# Patient Record
Sex: Female | Born: 1980 | Race: Black or African American | Hispanic: No | Marital: Married | Smoking: Never smoker
Health system: Southern US, Community
[De-identification: ages and names within clinical notes are randomized; demographics above are authoritative.]

## PROBLEM LIST (undated history)

## (undated) DIAGNOSIS — Z8619 Personal history of other infectious and parasitic diseases: Secondary | ICD-10-CM

## (undated) DIAGNOSIS — I10 Essential (primary) hypertension: Secondary | ICD-10-CM

## (undated) HISTORY — DX: Personal history of other infectious and parasitic diseases: Z86.19

## (undated) HISTORY — DX: Essential (primary) hypertension: I10

---

## 2000-10-08 ENCOUNTER — Emergency Department (HOSPITAL_COMMUNITY): Admission: EM | Admit: 2000-10-08 | Discharge: 2000-10-08 | Payer: Self-pay | Admitting: Emergency Medicine

## 2002-03-20 ENCOUNTER — Other Ambulatory Visit: Admission: RE | Admit: 2002-03-20 | Discharge: 2002-03-20 | Payer: Self-pay | Admitting: Gynecology

## 2005-11-17 ENCOUNTER — Other Ambulatory Visit: Admission: RE | Admit: 2005-11-17 | Discharge: 2005-11-17 | Payer: Self-pay | Admitting: Obstetrics and Gynecology

## 2012-02-17 ENCOUNTER — Other Ambulatory Visit (HOSPITAL_COMMUNITY): Payer: Self-pay | Admitting: Obstetrics and Gynecology

## 2012-02-17 DIAGNOSIS — E041 Nontoxic single thyroid nodule: Secondary | ICD-10-CM

## 2012-02-28 ENCOUNTER — Other Ambulatory Visit (HOSPITAL_COMMUNITY): Payer: Self-pay

## 2013-03-01 ENCOUNTER — Other Ambulatory Visit (HOSPITAL_COMMUNITY): Payer: Self-pay | Admitting: Obstetrics and Gynecology

## 2013-03-01 DIAGNOSIS — E041 Nontoxic single thyroid nodule: Secondary | ICD-10-CM

## 2013-03-05 ENCOUNTER — Ambulatory Visit (HOSPITAL_COMMUNITY): Admission: RE | Admit: 2013-03-05 | Payer: Self-pay | Source: Ambulatory Visit

## 2013-03-05 ENCOUNTER — Other Ambulatory Visit (HOSPITAL_COMMUNITY): Payer: Self-pay | Admitting: Obstetrics and Gynecology

## 2013-03-05 DIAGNOSIS — E041 Nontoxic single thyroid nodule: Secondary | ICD-10-CM

## 2013-03-12 ENCOUNTER — Ambulatory Visit (HOSPITAL_COMMUNITY)
Admission: RE | Admit: 2013-03-12 | Discharge: 2013-03-12 | Disposition: A | Discharge status: Home / Self Care | Payer: BC Managed Care – PPO | Source: Ambulatory Visit | Attending: Obstetrics and Gynecology | Admitting: Obstetrics and Gynecology

## 2013-03-12 DIAGNOSIS — E041 Nontoxic single thyroid nodule: Secondary | ICD-10-CM

## 2015-02-24 ENCOUNTER — Emergency Department (HOSPITAL_COMMUNITY): Payer: BLUE CROSS/BLUE SHIELD

## 2015-02-24 ENCOUNTER — Encounter (HOSPITAL_COMMUNITY): Payer: Self-pay | Admitting: Emergency Medicine

## 2015-02-24 ENCOUNTER — Emergency Department (HOSPITAL_COMMUNITY)
Admission: EM | Admit: 2015-02-24 | Discharge: 2015-02-24 | Disposition: A | Discharge status: Home / Self Care | Payer: BLUE CROSS/BLUE SHIELD | Attending: Emergency Medicine | Admitting: Emergency Medicine

## 2015-02-24 DIAGNOSIS — W108XXA Fall (on) (from) other stairs and steps, initial encounter: Secondary | ICD-10-CM | POA: Diagnosis not present

## 2015-02-24 DIAGNOSIS — S99912A Unspecified injury of left ankle, initial encounter: Secondary | ICD-10-CM | POA: Diagnosis present

## 2015-02-24 DIAGNOSIS — Z793 Long term (current) use of hormonal contraceptives: Secondary | ICD-10-CM | POA: Diagnosis not present

## 2015-02-24 DIAGNOSIS — Y998 Other external cause status: Secondary | ICD-10-CM | POA: Insufficient documentation

## 2015-02-24 DIAGNOSIS — Y9289 Other specified places as the place of occurrence of the external cause: Secondary | ICD-10-CM | POA: Diagnosis not present

## 2015-02-24 DIAGNOSIS — R52 Pain, unspecified: Secondary | ICD-10-CM

## 2015-02-24 DIAGNOSIS — Z79899 Other long term (current) drug therapy: Secondary | ICD-10-CM | POA: Insufficient documentation

## 2015-02-24 DIAGNOSIS — Y9389 Activity, other specified: Secondary | ICD-10-CM | POA: Diagnosis not present

## 2015-02-24 DIAGNOSIS — S93402A Sprain of unspecified ligament of left ankle, initial encounter: Secondary | ICD-10-CM | POA: Insufficient documentation

## 2015-02-24 MED ORDER — KETOROLAC TROMETHAMINE 60 MG/2ML IM SOLN: 60.0000 mg | Freq: Once | INTRAMUSCULAR | Status: DC

## 2015-02-24 MED ORDER — NAPROXEN 500 MG PO TABS: 500.0000 mg | ORAL_TABLET | Freq: Two times a day (BID) | ORAL | Status: DC

## 2015-02-24 MED ORDER — KETOROLAC TROMETHAMINE 60 MG/2ML IM SOLN
INTRAMUSCULAR | Status: AC
  Filled 2015-02-24: qty 2

## 2015-02-24 NOTE — ED Provider Notes (Signed)
CSN: [id number]     Arrival date & time 02/24/15  0123 History   First MD Initiated Contact with Patient 02/24/15 0133     Chief Complaint  Patient presents with  . Ankle Pain     (Consider location/radiation/quality/duration/timing/severity/associated sxs/prior Treatment) Patient is a 34 y.o. female presenting with ankle pain. The history is provided by the patient.  Ankle Pain Location:  Ankle Time since incident:  10 hours Injury: yes   Mechanism of injury: fall   Fall:    Fall occurred:  Down stairs (1 step)   Impact surface:  Hard floor   Point of impact:  Feet   Entrapped after fall: no   Ankle location:  L ankle Pain details:    Quality:  Aching   Radiates to:  Does not radiate   Severity:  Moderate   Timing:  Constant   Progression:  Unchanged Chronicity:  New Dislocation: no   Foreign body present:  No foreign bodies Prior injury to area:  No Relieved by:  Nothing Worsened by:  Nothing tried Ineffective treatments:  None tried Associated symptoms: no tingling   Risk factors: no concern for non-accidental trauma     History reviewed. No pertinent past medical history. History reviewed. No pertinent past surgical history. History reviewed. No pertinent family history. History  Substance Use Topics  . Smoking status: Never Smoker   . Smokeless tobacco: Not on file  . Alcohol Use: Yes     Comment: occ   OB History    No data available     Review of Systems  All other systems reviewed and are negative.     Allergies  Review of patient's allergies indicates no known allergies.  Home Medications   Prior to Admission medications   Medication Sig Start Date End Date Taking? Authorizing Provider  ALTAVERA 0.15-30 MG-MCG tablet Take 1 tablet by mouth daily. 02/04/15  Yes Historical Provider, MD  Aspirin-Acetaminophen-Caffeine (GOODYS EXTRA STRENGTH) [phone] MG PACK Take 1 Package by mouth every 6 (six) hours as needed (pain).   Yes Historical  Provider, MD  lisinopril (PRINIVIL,ZESTRIL) 10 MG tablet TAKE 1 TABLET (10 MG) BY MOUTH DAILY 12/19/14  Yes Historical Provider, MD   BP 115/74 mmHg  Pulse 80  Temp(Src) 98.4 F (36.9 C) (Oral)  Resp 18  Ht  (1.6 m)  Wt 175 lb (79.379 kg)  BMI 31.01 kg/m2  SpO2 100%  LMP 02/17/2015 Physical Exam  Constitutional: She is oriented to person, place, and time. She appears well-developed and well-nourished. No distress.  HENT:  Head: Normocephalic and atraumatic.  Right Ear: External ear normal.  Left Ear: External ear normal.  Mouth/Throat: Oropharynx is clear and moist.  Eyes: Conjunctivae and EOM are normal. Pupils are equal, round, and reactive to light.  Neck: Normal range of motion. Neck supple.  Cardiovascular: Normal rate, regular rhythm and intact distal pulses.   Pulmonary/Chest: Effort normal and breath sounds normal. No respiratory distress. She has no wheezes. She has no rales.  Abdominal: Soft. Bowel sounds are normal. There is no tenderness. There is no rebound and no guarding.  Musculoskeletal: Normal range of motion. She exhibits no edema.       Left ankle: Normal. She exhibits normal range of motion, no swelling, no ecchymosis, no deformity, no laceration and normal pulse. No head of 5th metatarsal and no proximal fibula tenderness found. Achilles tendon normal.  Neurological: She is alert and oriented to person, place, and time. She has normal reflexes.  Skin: Skin is warm and dry.  Psychiatric: She has a normal mood and affect.    ED Course  Procedures (including critical care time) Labs Review Labs Reviewed - No data to display  Imaging Review No results found.   EKG Interpretation None      MDM   Final diagnoses:  Pain    NSAIDs ice ASO and crutches    Anhthu Perdew, MD 02/24/15 [phone]

## 2015-02-24 NOTE — ED Notes (Signed)
Pt ambulating independently w/ steady gait on d/c in no acute distress, A&Ox4. D/c instructions reviewed w/ pt and family - pt and family deny any further questions or concerns at present. Rx given x1  

## 2015-02-24 NOTE — ED Notes (Signed)
Pt presents with L ankle pain and swelling. Pt reports falling down 2 carpeted steps last evening . Swelling noted

## 2016-02-03 LAB — OB RESULTS CONSOLE RPR: RPR: NONREACTIVE

## 2016-02-03 LAB — OB RESULTS CONSOLE GC/CHLAMYDIA
Chlamydia: NEGATIVE
GC PROBE AMP, GENITAL: NEGATIVE

## 2016-02-03 LAB — OB RESULTS CONSOLE HEPATITIS B SURFACE ANTIGEN: Hepatitis B Surface Ag: NEGATIVE

## 2016-02-03 LAB — OB RESULTS CONSOLE HIV ANTIBODY (ROUTINE TESTING): HIV: NONREACTIVE

## 2016-02-03 LAB — OB RESULTS CONSOLE ABO/RH: RH TYPE: POSITIVE

## 2016-02-03 LAB — OB RESULTS CONSOLE ANTIBODY SCREEN: ANTIBODY SCREEN: NEGATIVE

## 2016-02-03 LAB — OB RESULTS CONSOLE RUBELLA ANTIBODY, IGM: Rubella: IMMUNE

## 2016-04-28 ENCOUNTER — Encounter (HOSPITAL_COMMUNITY): Payer: Self-pay

## 2016-04-28 ENCOUNTER — Emergency Department (HOSPITAL_COMMUNITY)
Admission: EM | Admit: 2016-04-28 | Discharge: 2016-04-29 | Discharge status: Against Medical Advice | Payer: BLUE CROSS/BLUE SHIELD | Attending: Emergency Medicine | Admitting: Emergency Medicine

## 2016-04-28 DIAGNOSIS — O26891 Other specified pregnancy related conditions, first trimester: Secondary | ICD-10-CM | POA: Diagnosis not present

## 2016-04-28 DIAGNOSIS — Z7982 Long term (current) use of aspirin: Secondary | ICD-10-CM | POA: Insufficient documentation

## 2016-04-28 DIAGNOSIS — Z5321 Procedure and treatment not carried out due to patient leaving prior to being seen by health care provider: Secondary | ICD-10-CM | POA: Diagnosis not present

## 2016-04-28 DIAGNOSIS — Z791 Long term (current) use of non-steroidal anti-inflammatories (NSAID): Secondary | ICD-10-CM | POA: Insufficient documentation

## 2016-04-28 DIAGNOSIS — Z3A20 20 weeks gestation of pregnancy: Secondary | ICD-10-CM | POA: Diagnosis not present

## 2016-04-28 DIAGNOSIS — M5440 Lumbago with sciatica, unspecified side: Secondary | ICD-10-CM | POA: Insufficient documentation

## 2016-04-28 DIAGNOSIS — M545 Low back pain: Secondary | ICD-10-CM

## 2016-04-28 NOTE — ED Triage Notes (Signed)
Pt complains of upper back pain that radiates under her arms, she thinks it may be from the way she sleeps, no injury noted

## 2016-04-28 NOTE — ED Notes (Signed)
Bed: [facility] Expected date:  Expected time:  Means of arrival:  Comments: 

## 2016-04-29 NOTE — ED Notes (Addendum)
Pt is [redacted] weeks pregnant. C/o 4/10 pain in the middle back. Pt reports taking tylenol 500mg  at 22:30. Sitting upright is the only position helping the pain. Movement of the neck, arms, and deep breaths intensify the pain. Pt contacted PCP and was advised to take tylenol for 48 hours and if no relief follow up with an appointment. After taking a nap pain became so severe that pt decided to be seen in ED.

## 2016-04-29 NOTE — ED Provider Notes (Signed)
Patient eloped from the emergency department before provider evaluation. I did not see or evaluate this patient. Triage note states patient is [redacted] weeks pregnant and complains of 4/10 pain in the middle of her back. She reports taking Tylenol at 11:30 PM and sitting upright is feeling position that helps the pain.      Dierdre ForthHannah Amma Crear, PA-C 04/29/16 0305    April Palumbo, MD 04/29/16 [phone]

## 2016-04-29 NOTE — ED Notes (Signed)
Pt left before the provider could see them.

## 2016-09-15 ENCOUNTER — Encounter (HOSPITAL_COMMUNITY): Payer: Self-pay | Admitting: *Deleted

## 2016-09-15 ENCOUNTER — Telehealth (HOSPITAL_COMMUNITY): Payer: Self-pay | Admitting: *Deleted

## 2016-09-15 LAB — OB RESULTS CONSOLE GBS: STREP GROUP B AG: POSITIVE

## 2016-09-15 NOTE — Telephone Encounter (Signed)
Preadmission screen  

## 2016-09-20 ENCOUNTER — Encounter (HOSPITAL_COMMUNITY): Payer: Self-pay

## 2016-09-20 ENCOUNTER — Inpatient Hospital Stay (HOSPITAL_COMMUNITY)
Admission: AD | Admit: 2016-09-20 | Discharge: 2016-09-23 | DRG: 765 | Disposition: A | Discharge status: Home / Self Care | Payer: BLUE CROSS/BLUE SHIELD | Source: Ambulatory Visit | Attending: Obstetrics and Gynecology | Admitting: Obstetrics and Gynecology

## 2016-09-20 ENCOUNTER — Inpatient Hospital Stay (HOSPITAL_COMMUNITY): Payer: BLUE CROSS/BLUE SHIELD | Admitting: Anesthesiology

## 2016-09-20 ENCOUNTER — Encounter (HOSPITAL_COMMUNITY)
Admission: AD | Disposition: A | Discharge status: Home / Self Care | Payer: Self-pay | Source: Ambulatory Visit | Attending: Obstetrics and Gynecology

## 2016-09-20 DIAGNOSIS — D72829 Elevated white blood cell count, unspecified: Secondary | ICD-10-CM | POA: Diagnosis present

## 2016-09-20 DIAGNOSIS — O9912 Other diseases of the blood and blood-forming organs and certain disorders involving the immune mechanism complicating childbirth: Secondary | ICD-10-CM | POA: Diagnosis present

## 2016-09-20 DIAGNOSIS — Z3A40 40 weeks gestation of pregnancy: Secondary | ICD-10-CM

## 2016-09-20 DIAGNOSIS — Z833 Family history of diabetes mellitus: Secondary | ICD-10-CM

## 2016-09-20 DIAGNOSIS — Z3493 Encounter for supervision of normal pregnancy, unspecified, third trimester: Secondary | ICD-10-CM | POA: Diagnosis present

## 2016-09-20 DIAGNOSIS — Z8249 Family history of ischemic heart disease and other diseases of the circulatory system: Secondary | ICD-10-CM

## 2016-09-20 DIAGNOSIS — O99824 Streptococcus B carrier state complicating childbirth: Secondary | ICD-10-CM | POA: Diagnosis present

## 2016-09-20 DIAGNOSIS — Z823 Family history of stroke: Secondary | ICD-10-CM | POA: Diagnosis not present

## 2016-09-20 LAB — CBC
HCT: 39.5 % (ref 36.0–46.0)
HEMATOCRIT: 35.6 % — AB (ref 36.0–46.0)
HEMOGLOBIN: 13.8 g/dL (ref 12.0–15.0)
HEMOGLOBIN: 12.2 g/dL (ref 12.0–15.0)
MCH: 30.6 pg (ref 26.0–34.0)
MCH: 30 pg (ref 26.0–34.0)
MCHC: 34.9 g/dL (ref 30.0–36.0)
MCHC: 34.3 g/dL (ref 30.0–36.0)
MCV: 87.6 fL (ref 78.0–100.0)
MCV: 87.5 fL (ref 78.0–100.0)
Platelets: 282 10*3/uL (ref 150–400)
Platelets: 243 10*3/uL (ref 150–400)
RBC: 4.51 MIL/uL (ref 3.87–5.11)
RBC: 4.07 MIL/uL (ref 3.87–5.11)
RDW: 15 % (ref 11.5–15.5)
RDW: 14.9 % (ref 11.5–15.5)
WBC: 14.8 10*3/uL — ABNORMAL HIGH (ref 4.0–10.5)
WBC: 22.8 10*3/uL — AB (ref 4.0–10.5)

## 2016-09-20 LAB — RPR: RPR Ser Ql: NONREACTIVE

## 2016-09-20 LAB — TYPE AND SCREEN
ABO/RH(D): O POS
Antibody Screen: NEGATIVE

## 2016-09-20 LAB — ABO/RH: ABO/RH(D): O POS

## 2016-09-20 SURGERY — Surgical Case
Anesthesia: Epidural

## 2016-09-20 MED ORDER — DIBUCAINE 1 % RE OINT: 1.0000 "application " | TOPICAL_OINTMENT | RECTAL | Status: DC | PRN

## 2016-09-20 MED ORDER — OXYTOCIN 40 UNITS IN LACTATED RINGERS INFUSION - SIMPLE MED
1.0000 m[IU]/min | INTRAVENOUS | Status: DC
Start: 2016-09-20 — End: 2016-09-20
  Administered 2016-09-20: 2 m[IU]/min via INTRAVENOUS
  Filled 2016-09-20: qty 1000

## 2016-09-20 MED ORDER — ONDANSETRON HCL 4 MG/2ML IJ SOLN
4.0000 mg | Freq: Three times a day (TID) | INTRAMUSCULAR | Status: DC | PRN
  Administered 2016-09-21: 4 mg via INTRAVENOUS
  Filled 2016-09-20: qty 2

## 2016-09-20 MED ORDER — PHENYLEPHRINE 40 MCG/ML (10ML) SYRINGE FOR IV PUSH (FOR BLOOD PRESSURE SUPPORT)
80.0000 ug | PREFILLED_SYRINGE | INTRAVENOUS | Status: DC | PRN
  Filled 2016-09-20: qty 10

## 2016-09-20 MED ORDER — LIDOCAINE HCL (PF) 1 % IJ SOLN
INTRAMUSCULAR | Status: DC | PRN
  Administered 2016-09-20 (×2): 4 mL

## 2016-09-20 MED ORDER — LACTATED RINGERS IV SOLN
INTRAVENOUS | Status: DC
  Administered 2016-09-21: 06:00:00 via INTRAVENOUS

## 2016-09-20 MED ORDER — ACETAMINOPHEN 325 MG PO TABS: 650.0000 mg | ORAL_TABLET | ORAL | Status: DC | PRN

## 2016-09-20 MED ORDER — MENTHOL 3 MG MT LOZG: 1.0000 | LOZENGE | OROMUCOSAL | Status: DC | PRN

## 2016-09-20 MED ORDER — OXYCODONE HCL 5 MG PO TABS: 10.0000 mg | ORAL_TABLET | ORAL | Status: DC | PRN

## 2016-09-20 MED ORDER — OXYTOCIN BOLUS FROM INFUSION: 500.0000 mL | Freq: Once | INTRAVENOUS | Status: DC

## 2016-09-20 MED ORDER — COCONUT OIL OIL: 1.0000 "application " | TOPICAL_OIL | Status: DC | PRN

## 2016-09-20 MED ORDER — DIPHENHYDRAMINE HCL 50 MG/ML IJ SOLN: 12.5000 mg | INTRAMUSCULAR | Status: DC | PRN

## 2016-09-20 MED ORDER — OXYCODONE HCL 5 MG PO TABS
5.0000 mg | ORAL_TABLET | ORAL | Status: DC | PRN
Start: 2016-09-20 — End: 2016-09-23

## 2016-09-20 MED ORDER — SODIUM CHLORIDE 0.9 % IR SOLN
Status: DC | PRN
  Administered 2016-09-20: 1

## 2016-09-20 MED ORDER — CEFAZOLIN SODIUM-DEXTROSE 2-3 GM-% IV SOLR
INTRAVENOUS | Status: DC | PRN
  Administered 2016-09-20: 2 g via INTRAVENOUS

## 2016-09-20 MED ORDER — MEPERIDINE HCL 25 MG/ML IJ SOLN
INTRAMUSCULAR | Status: DC | PRN
  Administered 2016-09-20 (×2): 12.5 mg via INTRAVENOUS

## 2016-09-20 MED ORDER — LIDOCAINE HCL (PF) 1 % IJ SOLN: 30.0000 mL | INTRAMUSCULAR | Status: DC | PRN

## 2016-09-20 MED ORDER — LACTATED RINGERS IV SOLN: INTRAVENOUS | Status: DC

## 2016-09-20 MED ORDER — OXYTOCIN 40 UNITS IN LACTATED RINGERS INFUSION - SIMPLE MED: 2.5000 [IU]/h | INTRAVENOUS | Status: AC

## 2016-09-20 MED ORDER — FENTANYL CITRATE (PF) 100 MCG/2ML IJ SOLN: 25.0000 ug | INTRAMUSCULAR | Status: DC | PRN

## 2016-09-20 MED ORDER — FLEET ENEMA 7-19 GM/118ML RE ENEM: 1.0000 | ENEMA | RECTAL | Status: DC | PRN

## 2016-09-20 MED ORDER — LACTATED RINGERS IV SOLN
INTRAVENOUS | Status: DC | PRN
  Administered 2016-09-20: 20:00:00 via INTRAVENOUS

## 2016-09-20 MED ORDER — DIPHENHYDRAMINE HCL 25 MG PO CAPS: 25.0000 mg | ORAL_CAPSULE | Freq: Four times a day (QID) | ORAL | Status: DC | PRN

## 2016-09-20 MED ORDER — SIMETHICONE 80 MG PO CHEW
80.0000 mg | CHEWABLE_TABLET | Freq: Three times a day (TID) | ORAL | Status: DC
  Administered 2016-09-21 – 2016-09-23 (×7): 80 mg via ORAL
  Filled 2016-09-20 (×8): qty 1

## 2016-09-20 MED ORDER — ONDANSETRON HCL 4 MG/2ML IJ SOLN
INTRAMUSCULAR | Status: DC | PRN
  Administered 2016-09-20: 4 mg via INTRAVENOUS

## 2016-09-20 MED ORDER — OXYTOCIN 40 UNITS IN LACTATED RINGERS INFUSION - SIMPLE MED: 2.5000 [IU]/h | INTRAVENOUS | Status: DC

## 2016-09-20 MED ORDER — OXYTOCIN 10 UNIT/ML IJ SOLN
INTRAVENOUS | Status: DC | PRN
  Administered 2016-09-20: 40 [IU] via INTRAVENOUS

## 2016-09-20 MED ORDER — WITCH HAZEL-GLYCERIN EX PADS: 1.0000 "application " | MEDICATED_PAD | CUTANEOUS | Status: DC | PRN

## 2016-09-20 MED ORDER — EPHEDRINE 5 MG/ML INJ: 10.0000 mg | INTRAVENOUS | Status: DC | PRN

## 2016-09-20 MED ORDER — SIMETHICONE 80 MG PO CHEW
80.0000 mg | CHEWABLE_TABLET | ORAL | Status: DC
  Administered 2016-09-21 – 2016-09-22 (×2): 80 mg via ORAL
  Filled 2016-09-20 (×4): qty 1

## 2016-09-20 MED ORDER — KETOROLAC TROMETHAMINE 30 MG/ML IJ SOLN: 30.0000 mg | Freq: Four times a day (QID) | INTRAMUSCULAR | Status: AC | PRN

## 2016-09-20 MED ORDER — IBUPROFEN 600 MG PO TABS
600.0000 mg | ORAL_TABLET | Freq: Four times a day (QID) | ORAL | Status: DC
  Administered 2016-09-21 – 2016-09-23 (×10): 600 mg via ORAL
  Filled 2016-09-20 (×11): qty 1

## 2016-09-20 MED ORDER — LACTATED RINGERS IV SOLN
INTRAVENOUS | Status: DC
  Administered 2016-09-20: 15:00:00 via INTRAVENOUS

## 2016-09-20 MED ORDER — NALBUPHINE HCL 10 MG/ML IJ SOLN: 5.0000 mg | INTRAMUSCULAR | Status: DC | PRN

## 2016-09-20 MED ORDER — OXYCODONE-ACETAMINOPHEN 5-325 MG PO TABS: 1.0000 | ORAL_TABLET | ORAL | Status: DC | PRN

## 2016-09-20 MED ORDER — NALOXONE HCL 0.4 MG/ML IJ SOLN: 0.4000 mg | INTRAMUSCULAR | Status: DC | PRN

## 2016-09-20 MED ORDER — LACTATED RINGERS IV SOLN
INTRAVENOUS | Status: DC | PRN
  Administered 2016-09-20 (×2): via INTRAVENOUS

## 2016-09-20 MED ORDER — ONDANSETRON HCL 4 MG/2ML IJ SOLN
INTRAMUSCULAR | Status: AC
  Filled 2016-09-20: qty 2

## 2016-09-20 MED ORDER — MEPERIDINE HCL 25 MG/ML IJ SOLN
INTRAMUSCULAR | Status: AC
  Filled 2016-09-20: qty 1

## 2016-09-20 MED ORDER — SODIUM BICARBONATE 8.4 % IV SOLN
INTRAVENOUS | Status: DC | PRN
  Administered 2016-09-20 (×2): 5 mL via EPIDURAL

## 2016-09-20 MED ORDER — MORPHINE SULFATE (PF) 0.5 MG/ML IJ SOLN
INTRAMUSCULAR | Status: DC | PRN
  Administered 2016-09-20: 4 mg via EPIDURAL

## 2016-09-20 MED ORDER — LACTATED RINGERS IV SOLN
500.0000 mL | Freq: Once | INTRAVENOUS | Status: AC
  Administered 2016-09-20: 500 mL via INTRAVENOUS

## 2016-09-20 MED ORDER — SCOPOLAMINE 1 MG/3DAYS TD PT72
MEDICATED_PATCH | TRANSDERMAL | Status: DC | PRN
  Administered 2016-09-20: 1 via TRANSDERMAL

## 2016-09-20 MED ORDER — LIDOCAINE-EPINEPHRINE (PF) 2 %-1:200000 IJ SOLN
INTRAMUSCULAR | Status: AC
Start: 2016-09-20 — End: 2016-09-20
  Filled 2016-09-20: qty 20

## 2016-09-20 MED ORDER — SIMETHICONE 80 MG PO CHEW
80.0000 mg | CHEWABLE_TABLET | ORAL | Status: DC | PRN
  Administered 2016-09-22: 80 mg via ORAL

## 2016-09-20 MED ORDER — TETANUS-DIPHTH-ACELL PERTUSSIS 5-2.5-18.5 LF-MCG/0.5 IM SUSP: 0.5000 mL | Freq: Once | INTRAMUSCULAR | Status: DC

## 2016-09-20 MED ORDER — SCOPOLAMINE 1 MG/3DAYS TD PT72: 1.0000 | MEDICATED_PATCH | Freq: Once | TRANSDERMAL | Status: DC

## 2016-09-20 MED ORDER — PHENYLEPHRINE 40 MCG/ML (10ML) SYRINGE FOR IV PUSH (FOR BLOOD PRESSURE SUPPORT)
80.0000 ug | PREFILLED_SYRINGE | INTRAVENOUS | Status: DC | PRN
  Administered 2016-09-20: 80 ug via INTRAVENOUS

## 2016-09-20 MED ORDER — LACTATED RINGERS IV BOLUS (SEPSIS)
1000.0000 mL | Freq: Once | INTRAVENOUS | Status: AC
  Administered 2016-09-20: 1000 mL via INTRAVENOUS

## 2016-09-20 MED ORDER — METOCLOPRAMIDE HCL 5 MG/ML IJ SOLN: 10.0000 mg | Freq: Once | INTRAMUSCULAR | Status: DC | PRN

## 2016-09-20 MED ORDER — DIPHENHYDRAMINE HCL 25 MG PO CAPS: 25.0000 mg | ORAL_CAPSULE | ORAL | Status: DC | PRN

## 2016-09-20 MED ORDER — SOD CITRATE-CITRIC ACID 500-334 MG/5ML PO SOLN
30.0000 mL | ORAL | Status: DC | PRN
  Administered 2016-09-20: 30 mL via ORAL
  Filled 2016-09-20: qty 15

## 2016-09-20 MED ORDER — SENNOSIDES-DOCUSATE SODIUM 8.6-50 MG PO TABS
2.0000 | ORAL_TABLET | ORAL | Status: DC
  Administered 2016-09-21 – 2016-09-22 (×3): 2 via ORAL
  Filled 2016-09-20 (×4): qty 2

## 2016-09-20 MED ORDER — MEPERIDINE HCL 25 MG/ML IJ SOLN: 6.2500 mg | INTRAMUSCULAR | Status: DC | PRN

## 2016-09-20 MED ORDER — OXYTOCIN 10 UNIT/ML IJ SOLN
INTRAMUSCULAR | Status: AC
  Filled 2016-09-20: qty 4

## 2016-09-20 MED ORDER — SODIUM CHLORIDE 0.9% FLUSH: 3.0000 mL | INTRAVENOUS | Status: DC | PRN

## 2016-09-20 MED ORDER — PENICILLIN G POT IN DEXTROSE 60000 UNIT/ML IV SOLN
3.0000 10*6.[IU] | INTRAVENOUS | Status: DC
  Administered 2016-09-20 (×2): 3 10*6.[IU] via INTRAVENOUS
  Filled 2016-09-20 (×4): qty 50

## 2016-09-20 MED ORDER — FENTANYL 2.5 MCG/ML BUPIVACAINE 1/10 % EPIDURAL INFUSION (WH - ANES)
14.0000 mL/h | INTRAMUSCULAR | Status: DC | PRN
  Administered 2016-09-20: 14 mL/h via EPIDURAL
  Filled 2016-09-20: qty 100

## 2016-09-20 MED ORDER — NALBUPHINE HCL 10 MG/ML IJ SOLN: 5.0000 mg | Freq: Once | INTRAMUSCULAR | Status: DC | PRN

## 2016-09-20 MED ORDER — TERBUTALINE SULFATE 1 MG/ML IJ SOLN: 0.2500 mg | Freq: Once | INTRAMUSCULAR | Status: DC | PRN

## 2016-09-20 MED ORDER — ZOLPIDEM TARTRATE 5 MG PO TABS: 5.0000 mg | ORAL_TABLET | Freq: Every evening | ORAL | Status: DC | PRN

## 2016-09-20 MED ORDER — NALOXONE HCL 2 MG/2ML IJ SOSY: 1.0000 ug/kg/h | PREFILLED_SYRINGE | INTRAMUSCULAR | Status: DC | PRN

## 2016-09-20 MED ORDER — PENICILLIN G POTASSIUM 5000000 UNITS IJ SOLR
5.0000 10*6.[IU] | Freq: Once | INTRAVENOUS | Status: AC
  Administered 2016-09-20: 5 10*6.[IU] via INTRAVENOUS
  Filled 2016-09-20: qty 5

## 2016-09-20 MED ORDER — LACTATED RINGERS IV SOLN
500.0000 mL | INTRAVENOUS | Status: DC | PRN
  Administered 2016-09-20: 200 mL via INTRAVENOUS

## 2016-09-20 MED ORDER — ONDANSETRON HCL 4 MG/2ML IJ SOLN: 4.0000 mg | Freq: Four times a day (QID) | INTRAMUSCULAR | Status: DC | PRN

## 2016-09-20 MED ORDER — SCOPOLAMINE 1 MG/3DAYS TD PT72
MEDICATED_PATCH | TRANSDERMAL | Status: AC
  Filled 2016-09-20: qty 1

## 2016-09-20 MED ORDER — PRENATAL MULTIVITAMIN CH
1.0000 | ORAL_TABLET | Freq: Every day | ORAL | Status: DC
  Administered 2016-09-21 – 2016-09-22 (×2): 1 via ORAL
  Filled 2016-09-20 (×2): qty 1

## 2016-09-20 MED ORDER — OXYCODONE-ACETAMINOPHEN 5-325 MG PO TABS: 2.0000 | ORAL_TABLET | ORAL | Status: DC | PRN

## 2016-09-20 MED ORDER — MORPHINE SULFATE (PF) 0.5 MG/ML IJ SOLN
INTRAMUSCULAR | Status: AC
  Filled 2016-09-20: qty 10

## 2016-09-20 SURGICAL SUPPLY — 39 items
BENZOIN TINCTURE PRP APPL 2/3 (GAUZE/BANDAGES/DRESSINGS) ×3 IMPLANT
CHLORAPREP W/TINT 26ML (MISCELLANEOUS) ×3 IMPLANT
CLAMP CORD UMBIL (MISCELLANEOUS) IMPLANT
CLOSURE STERI-STRIP 1/2X4 (GAUZE/BANDAGES/DRESSINGS) ×1
CLOSURE WOUND 1/2 X4 (GAUZE/BANDAGES/DRESSINGS)
CLOTH BEACON ORANGE TIMEOUT ST (SAFETY) ×3 IMPLANT
CLSR STERI-STRIP ANTIMIC 1/2X4 (GAUZE/BANDAGES/DRESSINGS) ×2 IMPLANT
CONTAINER PREFILL 10% NBF 15ML (MISCELLANEOUS) IMPLANT
DERMABOND ADVANCED (GAUZE/BANDAGES/DRESSINGS)
DERMABOND ADVANCED .7 DNX12 (GAUZE/BANDAGES/DRESSINGS) IMPLANT
DRSG OPSITE POSTOP 4X10 (GAUZE/BANDAGES/DRESSINGS) ×3 IMPLANT
ELECT REM PT RETURN 9FT ADLT (ELECTROSURGICAL) ×3
ELECTRODE REM PT RTRN 9FT ADLT (ELECTROSURGICAL) ×1 IMPLANT
EXTRACTOR VACUUM M CUP 4 TUBE (SUCTIONS) IMPLANT
EXTRACTOR VACUUM M CUP 4' TUBE (SUCTIONS)
GLOVE BIO SURGEON STRL SZ8 (GLOVE) ×3 IMPLANT
GLOVE BIOGEL PI IND STRL 7.0 (GLOVE) ×1 IMPLANT
GLOVE BIOGEL PI INDICATOR 7.0 (GLOVE) ×2
GOWN STRL REUS W/TWL LRG LVL3 (GOWN DISPOSABLE) ×6 IMPLANT
HEMOSTAT SURGICEL 2X14 (HEMOSTASIS) ×3 IMPLANT
KIT ABG SYR 3ML LUER SLIP (SYRINGE) ×3 IMPLANT
NEEDLE HYPO 25X5/8 SAFETYGLIDE (NEEDLE) ×3 IMPLANT
NS IRRIG 1000ML POUR BTL (IV SOLUTION) ×3 IMPLANT
PACK C SECTION WH (CUSTOM PROCEDURE TRAY) ×3 IMPLANT
PAD ABD 7.5X8 STRL (GAUZE/BANDAGES/DRESSINGS) ×3 IMPLANT
PAD ABD DERMACEA PRESS 5X9 (GAUZE/BANDAGES/DRESSINGS) ×3 IMPLANT
PAD OB MATERNITY 4.3X12.25 (PERSONAL CARE ITEMS) ×3 IMPLANT
PENCIL SMOKE EVAC W/HOLSTER (ELECTROSURGICAL) ×3 IMPLANT
STRIP CLOSURE SKIN 1/2X4 (GAUZE/BANDAGES/DRESSINGS) IMPLANT
SUT MNCRL 0 VIOLET CTX 36 (SUTURE) ×4 IMPLANT
SUT MONOCRYL 0 CTX 36 (SUTURE) ×8
SUT PDS AB 0 CTX 60 (SUTURE) ×3 IMPLANT
SUT PLAIN 0 NONE (SUTURE) IMPLANT
SUT PLAIN 2 0 (SUTURE)
SUT PLAIN 2 0 XLH (SUTURE) IMPLANT
SUT PLAIN ABS 2-0 CT1 27XMFL (SUTURE) IMPLANT
SUT VIC AB 4-0 KS 27 (SUTURE) ×3 IMPLANT
TOWEL OR 17X24 6PK STRL BLUE (TOWEL DISPOSABLE) ×3 IMPLANT
TRAY FOLEY CATH SILVER 14FR (SET/KITS/TRAYS/PACK) ×3 IMPLANT

## 2016-09-20 NOTE — Anesthesia Procedure Notes (Signed)
Epidural Patient location during procedure: OB Start time: 09/20/2016 11:46 AM End time: 09/20/2016 11:56 AM  Staffing Anesthesiologist: Lewie LoronGERMEROTH, Heydy Montilla Performed: anesthesiologist   Preanesthetic Checklist Completed: patient identified, pre-op evaluation, timeout performed, IV checked, risks and benefits discussed and monitors and equipment checked  Epidural Patient position: sitting Prep: site prepped and draped and DuraPrep Patient monitoring: heart rate, continuous pulse ox and blood pressure Approach: midline Location: L3-L4 Injection technique: LOR air and LOR saline  Needle:  Needle type: Tuohy  Needle gauge: 17 G Needle length: 9 cm Needle insertion depth: 8 cm Catheter type: closed end flexible Catheter size: 19 Gauge Catheter at skin depth: 14 cm Test dose: negative  Assessment Sensory level: T8 Events: blood not aspirated, injection not painful, no injection resistance, negative IV test and no paresthesia  Additional Notes Reason for block:procedure for pain

## 2016-09-20 NOTE — Transfer of Care (Signed)
Immediate Anesthesia Transfer of Care Note  Patient: Kathy Dixon  Procedure(s) Performed: Procedure(s): CESAREAN SECTION (N/A)  Patient Location: PACU  Anesthesia Type:Epidural  Level of Consciousness: awake  Airway & Oxygen Therapy: Patient Spontanous Breathing  Post-op Assessment: Report given to RN and Post -op Vital signs reviewed and stable  Post vital signs: stable  Last Vitals:  Vitals:   09/20/16 1832 09/20/16 1835  BP: (!) 169/108 (!) 169/100  Pulse: (!) 232 (!) 124  Resp:    Temp:      Last Pain:  Vitals:   09/20/16 1859  TempSrc:   PainSc: 0-No pain      Patients Stated Pain Goal: 3 (09/20/16 0720)  Complications: No apparent anesthesia complications

## 2016-09-20 NOTE — MAU Note (Signed)
Pt presents complaining of contractions every 3 minutes since 1am with bloody show. Denies leaking. Reports good fetal movement.

## 2016-09-20 NOTE — Brief Op Note (Signed)
09/20/2016  8:11 PM  PATIENT:  Kathy Dixon  36 y.o. female  PRE-OPERATIVE DIAGNOSIS:  Arrest of dilation  POST-OPERATIVE DIAGNOSIS:  Arrest of dilation  PROCEDURE:  Procedure(s): CESAREAN SECTION (N/A) low transverse  SURGEON:  Surgeon(s) and Role:    * Harold HedgeJames Cindi Ghazarian, MD - Primary  PHYSICIAN ASSISTANT:   ASSISTANTS: none   ANESTHESIA:   epidural  EBL:  Total I/O In: 1200 [I.V.:1200] Out: 800 [Urine:100; Blood:700]  BLOOD ADMINISTERED:none  DRAINS: Urinary Catheter (Foley)   LOCAL MEDICATIONS USED:  NONE  SPECIMEN:  No Specimen  DISPOSITION OF SPECIMEN:  N/A  COUNTS:  YES  TOURNIQUET:  * No tourniquets in log *  DICTATION: .Other Dictation: Dictation Number [id number]  PLAN OF CARE: Admit to inpatient   PATIENT DISPOSITION:  PACU - hemodynamically stable.   Delay start of Pharmacological VTE agent (>24hrs) due to surgical blood loss or risk of bleeding: not applicable

## 2016-09-20 NOTE — Progress Notes (Signed)
Notified of pt arrival in MAU and exam. OK to admit to labor and delivery with routine orders.

## 2016-09-20 NOTE — Anesthesia Preprocedure Evaluation (Signed)
Anesthesia Evaluation  Patient identified by MRN, date of birth, ID band Patient awake    Reviewed: Allergy & Precautions, NPO status , Patient's Chart, lab work & pertinent test results  Airway Mallampati: II  TM Distance: >3 FB Neck ROM: Full    Dental no notable dental hx.    Pulmonary neg pulmonary ROS,    Pulmonary exam normal breath sounds clear to auscultation       Cardiovascular hypertension, negative cardio ROS Normal cardiovascular exam Rhythm:Regular Rate:Normal     Neuro/Psych negative neurological ROS  negative psych ROS   GI/Hepatic negative GI ROS, Neg liver ROS,   Endo/Other  negative endocrine ROS  Renal/GU negative Renal ROS     Musculoskeletal negative musculoskeletal ROS (+)   Abdominal   Peds  Hematology negative hematology ROS (+)   Anesthesia Other Findings   Reproductive/Obstetrics (+) Pregnancy                             Anesthesia Physical Anesthesia Plan  ASA: III  Anesthesia Plan: Epidural   Post-op Pain Management:    Induction:   Airway Management Planned:   Additional Equipment:   Intra-op Plan:   Post-operative Plan:   Informed Consent: I have reviewed the patients History and Physical, chart, labs and discussed the procedure including the risks, benefits and alternatives for the proposed anesthesia with the patient or authorized representative who has indicated his/her understanding and acceptance.     Plan Discussed with:   Anesthesia Plan Comments:         Anesthesia Quick Evaluation

## 2016-09-20 NOTE — Progress Notes (Signed)
Cx no change MVU 215-230 FHT multiple late decelerations noted-resolved after pitocin stopped Recommend cesarean section-D/W patient procedure and risks including infection, organ damage, bleeding/transfusion-HIV/Hep, DVT/PE, pneumonia.  All questions answered Patient states she understands and agrees

## 2016-09-20 NOTE — Anesthesia Postprocedure Evaluation (Signed)
Anesthesia Post Note  Patient: Kathy Dixon  Procedure(s) Performed: Procedure(s) (LRB): CESAREAN SECTION (N/A)  Patient location during evaluation: PACU Anesthesia Type: Epidural Level of consciousness: awake and alert Pain management: pain level controlled Vital Signs Assessment: post-procedure vital signs reviewed and stable Respiratory status: spontaneous breathing and respiratory function stable Cardiovascular status: blood pressure returned to baseline and stable Postop Assessment: no headache, no backache and epidural receding Anesthetic complications: no        Last Vitals:  Vitals:   09/20/16 2100 09/20/16 2115  BP: 116/67 112/74  Pulse: (!) 118 (!) 119  Resp: 16   Temp: 37.9 C     Last Pain:  Vitals:   09/20/16 2115  TempSrc:   PainSc: 3    Pain Goal: Patients Stated Pain Goal: 3 (09/20/16 0720)               Phillips Groutarignan, Leveta Wahab

## 2016-09-20 NOTE — Progress Notes (Signed)
Patient states contractions feel less frequent  Smiling FHT cat one UCs q3-4 min  Will begin Pitocin augmentation D/W patient risks She states she understands and agrees

## 2016-09-20 NOTE — Anesthesia Pain Management Evaluation Note (Signed)
  CRNA Pain Management Visit Note  Patient: Kathy Dixon, 36 y.o., female  "Hello I am a member of the anesthesia team at [facility]. We have an anesthesia team available at all times to provide care throughout the hospital, including epidural management and anesthesia for C-section. I don't know your plan for the delivery whether it a natural birth, water birth, IV sedation, nitrous supplementation, doula or epidural, but we want to meet your pain goals."   1.Was your pain managed to your expectations on prior hospitalizations?   No prior hospitalizations  2.What is your expectation for pain management during this hospitalization?     Epidural ?  3.How can we help you reach that goal? Not certain  Record the patient's initial score and the patient's pain goal.   Pain: 6  Pain Goal: 3 [facility] wants you to be able to say your pain was always managed very well.  Chanz Cahall 09/20/2016

## 2016-09-20 NOTE — Progress Notes (Signed)
FHT cat one, response to scalp stim Cx 5-6/C/-2 no change in about 1 1/2 hours IUPC placed Pitocin on D/W above with patient and husband

## 2016-09-20 NOTE — H&P (Signed)
Kathy CallerKia N Dixon is a 36 y.o. female presenting for contractions. OB History    Gravida Para Term Preterm AB Living   1 0 0 0 0 0   SAB TAB Ectopic Multiple Live Births   0 0 0 0 0     Past Medical History:  Diagnosis Date  . History of varicella   . Hypertension    no meds   History reviewed. No pertinent surgical history. Family History: family history includes Diabetes in her maternal aunt; Hypertension in her father, mother, and sister; Other in her father; Stroke in her maternal aunt and maternal uncle. Social History:  reports that she has never smoked. She has never used smokeless tobacco. She reports that she does not drink alcohol or use drugs.     Maternal Diabetes: No Genetic Screening: Normal Maternal Ultrasounds/Referrals: Normal Fetal Ultrasounds or other Referrals:  None Maternal Substance Abuse:  No Significant Maternal Medications:  None Significant Maternal Lab Results:  None Other Comments:  None  Review of Systems  Eyes: Negative for blurred vision.  Gastrointestinal: Negative for abdominal pain.  Neurological: Negative for headaches.   Maternal Medical History:  Reason for admission: Contractions.   Contractions: Onset was 3-5 hours ago.    Fetal activity: Perceived fetal activity is normal.      Dilation: 4 Effacement (%): 70 Station: -2 Exam by:: Kathy ShirtsV. Rogers RN Blood pressure 138/82, pulse 107, temperature 99 F (37.2 C), temperature source Oral, resp. rate 18, height 5\' 4"  (1.626 m), weight 238 lb (108 kg). Maternal Exam:  Uterine Assessment: Contraction strength is firm.  Contraction frequency is regular.   Abdomen: Patient reports no abdominal tenderness. Fetal presentation: vertex     Fetal Exam Fetal State Assessment: Category I - tracings are normal.     Physical Exam  Cardiovascular: Normal rate and regular rhythm.   Respiratory: Effort normal and breath sounds normal.  GI: Soft. There is no tenderness.  Neurological: She  has normal reflexes.    Cx 4/90/-2/vtx AROM clear  Prenatal labs: ABO, Rh: O/Positive/-- (06/13 0000) Antibody: Negative (06/13 0000) Rubella: Immune (06/13 0000) RPR: Nonreactive (06/13 0000)  HBsAg: Negative (06/13 0000)  HIV: Non-reactive (06/13 0000)  GBS: Positive (01/24 0000)   Assessment/Plan: 36 yo G1P0 @ 40 /67 weeks  Entering active labor GBBS +, ATB running D/W patient above and pitocin augmentation if needed Patient treated prior to pregnancy with antihypertensive medications. She stopped these prior to pregnancy and has not been re treated. Kathy Dixon,Kathy Dixon 09/20/2016, 8:17 AM

## 2016-09-20 NOTE — Progress Notes (Signed)
[id number]: Assumed care of pt. Labs drawn and tubed to lab. IV started L-hand. Pt tolerated well. Pt to wheelchair and taken to L&D per MD orders.

## 2016-09-21 LAB — CBC
HCT: 33.9 % — ABNORMAL LOW (ref 36.0–46.0)
Hemoglobin: 11.6 g/dL — ABNORMAL LOW (ref 12.0–15.0)
MCH: 30.3 pg (ref 26.0–34.0)
MCHC: 34.2 g/dL (ref 30.0–36.0)
MCV: 88.5 fL (ref 78.0–100.0)
Platelets: 243 10*3/uL (ref 150–400)
RBC: 3.83 MIL/uL — ABNORMAL LOW (ref 3.87–5.11)
RDW: 15 % (ref 11.5–15.5)
WBC: 26.4 10*3/uL — ABNORMAL HIGH (ref 4.0–10.5)

## 2016-09-21 MED ORDER — CEFAZOLIN SODIUM-DEXTROSE 2-4 GM/100ML-% IV SOLN: 2.0000 g | INTRAVENOUS | Status: DC

## 2016-09-21 NOTE — Op Note (Signed)
NAMBeatriz Dixon:  Dixon, Kathy                ACCOUNT NO.:  [id number]  MEDICAL RECORD NO.:  [id number]  LOCATION:  [id number]                        FACILITY:  [facility]  PHYSICIAN:  Guy SandiferJames E. Henderson Cloudomblin, M.D. DATE OF BIRTH:  Jan 10, 1981  DATE OF PROCEDURE:  09/20/2016 DATE OF DISCHARGE:                              OPERATIVE REPORT   PREOPERATIVE DIAGNOSIS:  Arrest of dilation.  POSTOPERATIVE DIAGNOSIS:  Arrest of dilation.  PROCEDURE:  Primary low-transverse cesarean section.  SURGEON:  Guy SandiferJames E. Henderson Cloudomblin, M.D.  ANESTHESIA:  Epidural.  ESTIMATED BLOOD LOSS:  750 mL.  SPECIMENS:  None.  FINDINGS:  Viable female infant.  Apgars, arterial cord pH, and birth weight pending.  INDICATIONS AND CONSENT:  This patient is a 36 year old, G1, P0, 40 and 6/7 weeks, who presents in labor.  She progressed to 6 cm dilation. IUPC was placed to assure good labor.  After approximately 3 hours of no cervical change despite good labor recommendation for cesarean section was made.  Also, there have been some small late decelerations.  These discontinued immediately upon stopping the Pitocin.  Potential risks and complications were reviewed preoperatively including, but not limited to, infection, organ damage, bleeding requiring transfusion of blood products with HIV and hepatitis acquisition, DVT, PE, pneumonia.  The patient states she understands and agrees.  All questions were answered and consent is signed on the chart.  DESCRIPTION OF PROCEDURE:  The patient was taken to the operating room, where she was identified and her epidural anesthetic was augmented to a surgical level.  She was placed in dorsal supine position with a 15- degree left lateral wedge.  Foley catheter was already in place.  She was prepped, 3-minute dry time was allowed, and she was draped in a sterile fashion.  Time-out undertaken.  After testing for adequate epidural anesthesia, skin was entered through a Pfannenstiel incision and  dissection was carried out in layers to the peritoneum.  The peritoneum was incised, extended superiorly and inferiorly. Vesicouterine peritoneum was taken down cephalad laterally.  Bladder flap was developed.  The bladder blade was placed.  Uterus was incised in a low-transverse manner and the uterine cavity was entered bluntly with a hemostat.  The uterine incision was extended cephalad laterally with the fingers.  Vertex was then delivered.  Remainder of baby was delivered.  Cord was clamped and cut, and the baby was handed to awaiting pediatrics team.  Placenta was manually delivered.  Uterine cavity was clean.  Uterus was closed in a running, locking fashion with 2 imbricating layers of 0 Monocryl suture.  Additional figure-of-eights of the same suture as well as figure-of-eight of 2-0 chromic were placed over the center of the incision to obtain complete hemostasis.  Surgicel was then placed over the center of the incision and remains in place. Lavage was previously carried out.  Anterior peritoneum was closed in a running fashion with 0 Monocryl suture, which was also used to reapproximate the pyramidalis muscle in midline.  Anterior rectus sheath was closed in a running fashion with a 0 looped PDS suture.  Subcutaneous layers were closed with interrupted plain and the skin was closed in a subcuticular fashion with a 4-0 Vicryl on  a Mellody Dance needle.  Benzoin and Steri-Strips pressure dressing was applied.  All counts were correct, and the patient was taken to the recovery room in stable condition.     Guy Sandifer Henderson Cloud, M.D.     JET/MEDQ  D:  09/20/2016  T:  09/21/2016  Job:  [id number]

## 2016-09-21 NOTE — Anesthesia Postprocedure Evaluation (Signed)
Anesthesia Post Note  Patient: Kathy Dixon  Procedure(s) Performed: Procedure(s) (LRB): CESAREAN SECTION (N/A)  Patient location during evaluation: Mother Baby Anesthesia Type: Epidural Level of consciousness: awake and alert Pain management: pain level controlled Vital Signs Assessment: post-procedure vital signs reviewed and stable Respiratory status: spontaneous breathing and nonlabored ventilation Cardiovascular status: stable Postop Assessment: no headache, patient able to bend at knees, no backache, no signs of nausea or vomiting, epidural receding and adequate PO intake Anesthetic complications: no        Last Vitals:  Vitals:   09/21/16 0100 09/21/16 0500  BP: 121/60   Pulse: (!) 104   Resp: 18 18  Temp: 36.8 C 36.7 C    Last Pain:  Vitals:   09/21/16 0500  TempSrc: Oral  PainSc: 0-No pain   Pain Goal: Patients Stated Pain Goal: 2 (09/21/16 0100)               Laban EmperorMalinova,Charmian Forbis Hristova

## 2016-09-21 NOTE — Plan of Care (Signed)
Problem: Coping: Goal: Ability to cope will improve Patient becomes very emotional with breastfeeding. She is motivated to breastfeed but having difficulty latching baby independently. RN and Lactation Consultant have assisted patient and her infant with feeding. Will continue to support.

## 2016-09-21 NOTE — Progress Notes (Signed)
Subjective: Postpartum Day 1: Cesarean Delivery Patient reports tolerating PO.    Objective: Vital signs in last 24 hours: Temp:  [98 F (36.7 C)-100.2 F (37.9 C)] 98.1 F (36.7 C) (01/30 0500) Pulse Rate:  [98-232] 104 (01/30 0100) Resp:  [16-20] 18 (01/30 0500) BP: (108-169)/(60-108) 121/60 (01/30 0100) SpO2:  [94 %-100 %] 96 % (01/30 0500)  Physical Exam:  General: alert and cooperative Lochia: appropriate Uterine Fundus: firm Incision: healing well, abd pressure dressing removed, honeycomb dressing replaced, no active bleeding noted DVT Evaluation: No evidence of DVT seen on physical exam. Negative Homan's sign. No cords or calf tenderness. Calf/Ankle edema is present.   Recent Labs  09/20/16 2128 09/21/16 0604  HGB 12.2 11.6*  HCT 35.6* 33.9*    Assessment/Plan: Status post Cesarean section. Postoperative course complicated by leukocytosis  Continue current care plan to repeat CBC in am.  CURTIS,CAROL G 09/21/2016, 8:03 AM

## 2016-09-21 NOTE — Lactation Note (Signed)
This note was copied from a baby's chart. Lactation Consultation Note  Patient Name: Kathy Dixon Today's Date: 09/21/2016 Reason for consult: Follow-up assessment Baby has fed well today with the help of RN but mom is unable to latch baby independently.  Mom is very emotional and crying.  A lot of support given and option to give baby formula discussed.  We also discussed supporting any decision that will assist in bonding and enjoying her baby.  Mom decided to attempt latch with LC assist.  Mom has slightly edematous areola and teacup hold needed to allow baby to grasp tissue.  After a few attempts baby latched well and nursed for 10 minutes.  Mom has shells in the room but is not wearing them.  Instructed to put bra and shells on.  Mom emotional off and on during consult and needed much reassurance.  Baby sleeping after feed.  Mom knows to call for assist prn.  Maternal Data    Feeding Feeding Type: Breast Fed Length of feed: 10 min  LATCH Score/Interventions Latch: Grasps breast easily, tongue down, lips flanged, rhythmical sucking. Intervention(s): Adjust position;Assist with latch;Breast massage;Breast compression  Audible Swallowing: A few with stimulation Intervention(s): Alternate breast massage  Type of Nipple: Everted at rest and after stimulation  Comfort (Breast/Nipple): Soft / non-tender     Hold (Positioning): Assistance needed to correctly position infant at breast and maintain latch. Intervention(s): Breastfeeding basics reviewed;Support Pillows;Position options  LATCH Score: 8  Lactation Tools Discussed/Used     Consult Status Consult Status: Follow-up Date: 09/22/16 Follow-up type: In-patient    Huston FoleyMOULDEN, Jonluke Cobbins S 09/21/2016, 6:33 PM

## 2016-09-21 NOTE — Lactation Note (Signed)
This note was copied from a baby's chart. Lactation Consultation Note New mom has very large heavy breast. Mom has generalized edema. Dependent edema to breast. Has edema to areola and nipple, especiaclly Rt. Breast. reverese pressure to areola noted helpful. Encouraged to stimulate short shaft evert nipple more before latching. Hand pump given to evert nipple prior to latching. Shells given to wear in bra in [place].  Hand expression taught, no colostrum noted. Mom not happy about not seeing colostrum. Explained consistency of colostrum and massaging breast. Mom does have difficulty w/positioning and visualizing nipples , mom has to pull them up and back to see nipples. Encouraged to place dry cloth under breast prior to latching for lifted support.  Discussed positions for BF. Encouraged football for learning to BF.  Educated mom on newborn behavior, feeding habits and cluster feeding.  Mom encouraged to feed baby 8-12 times/24 hours and with feeding cues. Mom encouraged to waken baby for feeds. Discussed spoon feeding and stimulating baby to BF.  Stress importance of STS, I&O, supply and demand. Encourage mom to call for assistance when needed.  [facility] brochure given w/resources, support groups and LC services. Patient Name: Kathy Dixon [id number]'[id number]'s Date: Dixon Reason for consult: Initial assessment   Maternal Data Has patient been taught Hand Expression?: Yes Does the patient have breastfeeding experience prior to this delivery?: No  Feeding    LATCH Score/Interventions                      Lactation Tools Discussed/Used Tools: Shells;Pump Shell Type: Inverted Breast pump type: Manual Pump Review: Setup, frequency, and cleaning;Milk Storage Initiated by:: Kathy JeffersonL. Jaramiah Bossard RN IBCLC Date initiated:: 09/21/16   Consult Status Consult Status: Follow-up Date: 09/21/16 Follow-up type: In-patient    Kathy Dixon, Kathy Dixon, Kathy Dixon

## 2016-09-22 ENCOUNTER — Encounter (HOSPITAL_COMMUNITY): Payer: Self-pay | Admitting: Obstetrics and Gynecology

## 2016-09-22 ENCOUNTER — Inpatient Hospital Stay (HOSPITAL_COMMUNITY): Admission: RE | Admit: 2016-09-22 | Payer: BLUE CROSS/BLUE SHIELD | Source: Ambulatory Visit

## 2016-09-22 LAB — CBC
HCT: 31.2 % — ABNORMAL LOW (ref 36.0–46.0)
Hemoglobin: 10.7 g/dL — ABNORMAL LOW (ref 12.0–15.0)
MCH: 30.3 pg (ref 26.0–34.0)
MCHC: 34.3 g/dL (ref 30.0–36.0)
MCV: 88.4 fL (ref 78.0–100.0)
PLATELETS: 254 10*3/uL (ref 150–400)
RBC: 3.53 MIL/uL — ABNORMAL LOW (ref 3.87–5.11)
RDW: 15.4 % (ref 11.5–15.5)
WBC: 26.5 10*3/uL — ABNORMAL HIGH (ref 4.0–10.5)

## 2016-09-22 NOTE — Progress Notes (Signed)
Subjective: Postpartum Day 2: Cesarean Delivery Patient reports tolerating PO and no problems voiding.    Objective: Vital signs in last 24 hours: Temp:  [98.2 F (36.8 C)-99.5 F (37.5 C)] 98.2 F (36.8 C) (01/31 0617) Pulse Rate:  [77-125] 101 (01/31 0617) Resp:  [14-18] 18 (01/31 0617) BP: (101-121)/(60-80) 121/80 (01/31 0617) SpO2:  [99 %] 99 % (01/30 0900)  Physical Exam:  General: alert, cooperative and fatigued Lochia: appropriate Uterine Fundus: firm Incision: healing well DVT Evaluation: No evidence of DVT seen on physical exam. Negative Homan's sign. No cords or calf tenderness. Calf/Ankle edema is present. Abd slightly distended, BS+  Recent Labs  09/21/16 0604 09/22/16 0544  HGB 11.6* 10.7*  HCT 33.9* 31.2*    Assessment/Plan: Status post Cesarean section. Postoperative course complicated by leukocytosis  Continue current care Encouraged increased ambulation and warm beverages.  Melba Araki G 09/22/2016, 8:16 AM

## 2016-09-23 ENCOUNTER — Encounter (HOSPITAL_COMMUNITY): Payer: Self-pay | Admitting: Obstetrics and Gynecology

## 2016-09-23 LAB — CBC
HCT: 29.1 % — ABNORMAL LOW (ref 36.0–46.0)
HEMOGLOBIN: 9.9 g/dL — AB (ref 12.0–15.0)
MCH: 29.9 pg (ref 26.0–34.0)
MCHC: 34 g/dL (ref 30.0–36.0)
MCV: 87.9 fL (ref 78.0–100.0)
PLATELETS: 266 10*3/uL (ref 150–400)
RBC: 3.31 MIL/uL — ABNORMAL LOW (ref 3.87–5.11)
RDW: 15.3 % (ref 11.5–15.5)
WBC: 21.6 10*3/uL — ABNORMAL HIGH (ref 4.0–10.5)

## 2016-09-23 MED ORDER — IBUPROFEN 600 MG PO TABS: 600.0000 mg | ORAL_TABLET | Freq: Four times a day (QID) | ORAL | 1 refills | Status: DC

## 2016-09-23 MED ORDER — OXYCODONE HCL 5 MG PO TABS: 5.0000 mg | ORAL_TABLET | ORAL | 0 refills | Status: DC | PRN

## 2016-09-23 NOTE — Lactation Note (Signed)
This note was copied from a baby's chart. Lactation Consultation Note  Patient Name: Kathy Dixon Today's Date: 09/23/2016   Baby cueing and FOB getting ready to give baby bottle upon entering. FOB states mother wanted to rest so he was going to give a bottle. Discussed supply and demand and offered to help with latching. Mother stated she would try breastfeeding.  Mother moved a little to get in position and started crying in pain. [facility] that mother is crying in pain in her back area.     Maternal Data    Feeding Feeding Type: Breast Fed Nipple Type: Slow - flow  LATCH Score/Interventions                      Lactation Tools Discussed/Used     Consult Status      Hardie PulleyBerkelhammer, Kathy Dixon 09/23/2016, 8:10 AM

## 2016-09-23 NOTE — Discharge Summary (Signed)
Obstetric Discharge Summary Reason for Admission: onset of labor Prenatal Procedures: ultrasound Intrapartum Procedures: cesarean: low cervical, transverse Postpartum Procedures: none Complications-Operative and Postpartum: none Hemoglobin  Date Value Ref Range Status  09/23/2016 9.9 (L) 12.0 - 15.0 g/dL Final   HCT  Date Value Ref Range Status  09/23/2016 29.1 (L) 36.0 - 46.0 % Final    Physical Exam:  General: alert and cooperative Lochia: appropriate Uterine Fundus: firm Incision: healing well DVT Evaluation: No evidence of DVT seen on physical exam. Negative Homan's sign. No cords or calf tenderness. Calf/Ankle edema is present.  Discharge Diagnoses: Term Pregnancy-delivered  Discharge Information: Date: 09/23/2016 Activity: pelvic rest Diet: routine Medications: PNV, Ibuprofen and Percocet Condition: stable Instructions: refer to practice specific booklet Discharge to: home   Newborn Data: Live born female  Birth Weight: 7 lb 9.7 oz (3450 g) APGAR: 8, 9  Home with mother.  Chavez Rosol G 09/23/2016, 8:50 AM

## 2016-09-23 NOTE — Lactation Note (Signed)
This note was copied from a baby's chart. Lactation Consultation Note  Patient Name: Kathy Dixon Today's Date: 09/23/2016 Reason for consult: Follow-up assessment;Difficult latch Baby had fallen asleep at breast when LC arrived. FOB had given formula prior to Mom latching baby. Parents are BR/BO by choice. LC encouraged parents to BF prior to giving any bottles. Reviewed supply/demand. Mom reports she is not using the nipple shield to latch. Mom has own DEBP and needed kit to use with pump and requested [place] assist Mom with pumping so she will know how to use DEBP. Reviewed pumping and breast milk storage guidelines. Engorgement care reviewed. Discussed feeding plan of BF with each feeding, advised baby needs to be at breast at least 8-12 times in 24 hours, nursing for 15-20 minutes, FOB to give supplement till Mom's milk comes in per hours of age, Mom to post pump for 15 minutes to encourage milk production, prevent engorgement and protect milk supply. Also discussed how to pump/bottle if this is more preferable. Supplemental guideline sheets given to parents and reviewed with parents for BR/BO or pump/bottle. [place] left phone number for Mom to call with next feeding to assist with latch. Advised of OP services and support group.   Maternal Data    Feeding Feeding Type: Breast Fed Nipple Type: Slow - flow Length of feed: 10 min  LATCH Score/Interventions Latch: Repeated attempts needed to sustain latch, nipple held in mouth throughout feeding, stimulation needed to elicit sucking reflex. Intervention(s): Adjust position;Assist with latch;Breast massage;Breast compression  Audible Swallowing: A few with stimulation Intervention(s): Hand expression;Skin to skin  Type of Nipple: Everted at rest and after stimulation  Comfort (Breast/Nipple): Soft / non-tender     Hold (Positioning): Assistance needed to correctly position infant at breast and maintain latch. Intervention(s): Support  Pillows;Position options;Breastfeeding basics reviewed;Skin to skin  LATCH Score: 7  Lactation Tools Discussed/Used Tools: Pump;Nipple Shields Nipple shield size: 20 Shell Type: Inverted Breast pump type: Manual   Consult Status Consult Status: Follow-up Date: 09/23/16 Follow-up type: In-patient    Katrine Coho 09/23/2016, 9:31 AM

## 2020-05-02 ENCOUNTER — Ambulatory Visit: Payer: BC Managed Care – PPO | Admitting: Family

## 2020-05-02 ENCOUNTER — Other Ambulatory Visit: Payer: Self-pay

## 2020-05-02 ENCOUNTER — Ambulatory Visit (INDEPENDENT_AMBULATORY_CARE_PROVIDER_SITE_OTHER): Payer: BC Managed Care – PPO

## 2020-05-02 VITALS — BP 118/62 | HR 73 | Temp 98.8°F | Ht 64.0 in | Wt 209.4 lb

## 2020-05-02 DIAGNOSIS — E559 Vitamin D deficiency, unspecified: Secondary | ICD-10-CM

## 2020-05-02 DIAGNOSIS — M5442 Lumbago with sciatica, left side: Secondary | ICD-10-CM

## 2020-05-02 DIAGNOSIS — Z13220 Encounter for screening for lipoid disorders: Secondary | ICD-10-CM | POA: Diagnosis not present

## 2020-05-02 DIAGNOSIS — Z0000 Encounter for general adult medical examination without abnormal findings: Secondary | ICD-10-CM | POA: Diagnosis not present

## 2020-05-02 DIAGNOSIS — Z23 Encounter for immunization: Secondary | ICD-10-CM | POA: Diagnosis not present

## 2020-05-02 NOTE — Progress Notes (Signed)
Kathy Dixon is a 39 y.o. female with the following history as recorded in EpicCare:  Patient Active Problem List   Diagnosis Date Noted  . Normal labor and delivery 09/20/2016    Current Outpatient Medications  Medication Sig Dispense Refill  . levonorgestrel (KYLEENA) 19.5 MG IUD by Intrauterine route once.    . Omega-3 Fatty Acids (FISH OIL) 1000 MG CAPS Take 1 capsule by mouth daily.     No current facility-administered medications for this visit.    Allergies: Patient has no known allergies.  Past Medical History:  Diagnosis Date  . History of varicella   . Hypertension    no meds    Past Surgical History:  Procedure Laterality Date  . CESAREAN SECTION N/A 09/20/2016   Procedure: CESAREAN SECTION;  Surgeon: Everlene Farrier, MD;  Location: [place];  Service: Obstetrics;  Laterality: N/A;    Family History  Problem Relation Age of Onset  . Hypertension Mother   . Hypertension Father   . Other Father        myelofibrosis  . Hypertension Sister   . Diabetes Maternal Aunt   . Stroke Maternal Aunt   . Stroke Maternal Uncle     Social History   Tobacco Use  . Smoking status: Never Smoker  . Smokeless tobacco: Never Used  Substance Use Topics  . Alcohol use: No    Comment: occ    Subjective:  Patient presents today as a new patient; would like to get CPE updated; scheduled to see GYN later this year; notes she has been having increased problems with left sided sciatic pain;  Is actively working on weight loss- has lost 10 pounds since May 2021; exercising 5-6 days per week; Working on eating about 1500 calories per day;  Up to date on dental and vision exams;   LMP- IUD/ periods are light  Career- works as Music therapist at [employer]    Objective:  Vitals:   05/02/20 0906  BP: 118/62  Pulse: 73  Temp: 98.8 F (37.1 C)  TempSrc: Oral  SpO2: 99%  Weight: 209 lb 6.4 oz (95 kg)  Height: 5' 4"  (1.626 m)    General: Well developed, well  nourished, in no acute distress  Skin : Warm and dry.  Head: Normocephalic and atraumatic  Eyes: Sclera and conjunctiva clear; pupils round and reactive to light; extraocular movements intact  Ears: External normal; canals clear; tympanic membranes normal  Oropharynx: Pink, supple. No suspicious lesions  Neck: Supple without thyromegaly, adenopathy  Lungs: Respirations unlabored; clear to auscultation bilaterally without wheeze, rales, rhonchi  CVS exam: normal rate and regular rhythm.  Abdomen: Soft; nontender; nondistended; normoactive bowel sounds; no masses or hepatosplenomegaly  Musculoskeletal: No deformities; no active joint inflammation  Extremities: No edema, cyanosis, clubbing  Vessels: Symmetric bilaterally  Neurologic: Alert and oriented; speech intact; face symmetrical; moves all extremities well; CNII-XII intact without focal deficit  Assessment:  1. PE (physical exam), annual   2. Left-sided low back pain with left-sided sciatica, unspecified chronicity   3. Lipid screening   4. Vitamin D deficiency     Plan:  Age appropriate preventive healthcare needs addressed; encouraged regular eye doctor and dental exams; encouraged regular exercise; will update labs and refills as needed today; follow-up to be determined; Update lumbar X-ray today- will most likely need to refer to sports medicine ( Dr. Tamala Julian for manipulation);   This visit occurred during the SARS-CoV-2 public health emergency.  Safety protocols were  in place, including screening questions prior to the visit, additional usage of staff PPE, and extensive cleaning of exam room while observing appropriate contact time as indicated for disinfecting solutions.      No follow-ups on file.  Orders Placed This Encounter  Procedures  . DG Lumbar Spine Complete    Standing Status:   Future    Standing Expiration Date:   05/02/2021    Order Specific Question:   Reason for Exam (SYMPTOM  OR DIAGNOSIS REQUIRED)    Answer:    low back pain    Order Specific Question:   Is patient pregnant?    Answer:   No    Order Specific Question:   Preferred imaging location?    Answer:   [facility]    Order Specific Question:   Radiology Contrast Protocol - do NOT remove file path    Answer:   [email or web address]  . CBC with Differential/Platelet    Standing Status:   Future    Standing Expiration Date:   05/02/2021  . Comp Met (CMET)    Standing Status:   Future    Standing Expiration Date:   05/02/2021  . Lipid panel    Standing Status:   Future    Standing Expiration Date:   05/02/2021  . TSH    Standing Status:   Future    Standing Expiration Date:   05/02/2021  . Vitamin D (25 hydroxy)    Standing Status:   Future    Standing Expiration Date:   05/02/2021    Requested Prescriptions    No prescriptions requested or ordered in this encounter

## 2020-05-03 LAB — CBC WITH DIFFERENTIAL/PLATELET
Absolute Monocytes: 601 cells/uL (ref 200–950)
Basophils Absolute: 33 cells/uL (ref 0–200)
Basophils Relative: 0.5 %
Eosinophils Absolute: 59 cells/uL (ref 15–500)
Eosinophils Relative: 0.9 %
HCT: 41.7 % (ref 35.0–45.0)
Hemoglobin: 13.5 g/dL (ref 11.7–15.5)
Lymphs Abs: 2244 cells/uL (ref 850–3900)
MCH: 29.8 pg (ref 27.0–33.0)
MCHC: 32.4 g/dL (ref 32.0–36.0)
MCV: 92.1 fL (ref 80.0–100.0)
MPV: 10.7 fL (ref 7.5–12.5)
Monocytes Relative: 9.1 %
Neutro Abs: 3663 cells/uL (ref 1500–7800)
Neutrophils Relative %: 55.5 %
Platelets: 305 10*3/uL (ref 140–400)
RBC: 4.53 10*6/uL (ref 3.80–5.10)
RDW: 12 % (ref 11.0–15.0)
Total Lymphocyte: 34 %
WBC: 6.6 10*3/uL (ref 3.8–10.8)

## 2020-05-03 LAB — COMPREHENSIVE METABOLIC PANEL
AG Ratio: 1.4 (calc) (ref 1.0–2.5)
ALT: 17 U/L (ref 6–29)
AST: 16 U/L (ref 10–30)
Albumin: 4.5 g/dL (ref 3.6–5.1)
Alkaline phosphatase (APISO): 43 U/L (ref 31–125)
BUN: 10 mg/dL (ref 7–25)
CO2: 25 mmol/L (ref 20–32)
Calcium: 9.3 mg/dL (ref 8.6–10.2)
Chloride: 103 mmol/L (ref 98–110)
Creat: 0.79 mg/dL (ref 0.50–1.10)
Globulin: 3.2 g/dL (calc) (ref 1.9–3.7)
Glucose, Bld: 81 mg/dL (ref 65–99)
Potassium: 4.2 mmol/L (ref 3.5–5.3)
Sodium: 138 mmol/L (ref 135–146)
Total Bilirubin: 0.5 mg/dL (ref 0.2–1.2)
Total Protein: 7.7 g/dL (ref 6.1–8.1)

## 2020-05-03 LAB — LIPID PANEL
Cholesterol: 137 mg/dL (ref <200)
HDL: 45 mg/dL — ABNORMAL LOW (ref >=50)
LDL Cholesterol (Calc): 77 mg/dL (calc)
Non-HDL Cholesterol (Calc): 92 mg/dL (calc) (ref <130)
Total CHOL/HDL Ratio: 3 (calc) (ref <5.0)
Triglycerides: 72 mg/dL (ref <150)

## 2020-05-03 LAB — TSH: TSH: 1.8 mIU/L

## 2020-05-03 LAB — VITAMIN D 25 HYDROXY (VIT D DEFICIENCY, FRACTURES): Vit D, 25-Hydroxy: 31 ng/mL (ref 30–100)

## 2020-06-03 ENCOUNTER — Other Ambulatory Visit: Payer: Self-pay

## 2020-06-03 ENCOUNTER — Ambulatory Visit: Payer: BC Managed Care – PPO | Admitting: Family Medicine

## 2020-06-03 ENCOUNTER — Encounter: Payer: Self-pay | Admitting: Family Medicine

## 2020-06-03 VITALS — BP 122/86 | HR 74 | Ht 64.0 in | Wt 202.0 lb

## 2020-06-03 DIAGNOSIS — M999 Biomechanical lesion, unspecified: Secondary | ICD-10-CM

## 2020-06-03 DIAGNOSIS — M4316 Spondylolisthesis, lumbar region: Secondary | ICD-10-CM

## 2020-06-03 NOTE — Assessment & Plan Note (Signed)

## 2020-06-03 NOTE — Patient Instructions (Addendum)
Exercises 3x a week Tried manipulation today Vit D 2000IU daily See me in 4-6 weeks

## 2020-06-03 NOTE — Assessment & Plan Note (Signed)
Patient does have more of a spondylolisthesis noted.  Discussed icing regimen and home exercise, which activities to do which wants to avoid.  Discussed posture and ergonomics.  Patient responded well to manipulation.  Increase activity slowly.  Follow-up with me again in 4 to 6 weeks.  Worsening pain will consider medications as well as physical therapy.

## 2020-06-03 NOTE — Progress Notes (Signed)
[name] [address] [place] [address] Phone: [phone] Subjective:   Kathy Dixon, am serving as a scribe for Dr. Antoine Primas. This visit occurred during the SARS-CoV-2 public health emergency.  Safety protocols were in place, including screening questions prior to the visit, additional usage of staff PPE, and extensive cleaning of exam room while observing appropriate contact time as indicated for disinfecting solutions.   I'm seeing this patient by the request  of:  Kathy Bass, FNP  CC: Low back pain  ACZ:[id number]   05/02/2020 IMPRESSION: Lumbar spine numbered the lowest segmented appearing lumbar shaped vertebrae on lateral view as L5. 9 mm anterolisthesis L4 on L5. No acute bony abnormality identified. Exam appears stable from prior study of 07/11/2019.  This was independently visualized and interpreted by me as well.   Up[date 06/03/2020 Kathy Dixon is a 39 y.o. female coming in with complaint of low back pain. Patient states that she has been having right leg stiffness in hamstring and lower back. Also experiencing numbness in left leg over lateral aspect but transitions into medial malleolus. This developed after having a C section a couple years ago. Patient has lost 20 pounds and her pain has improved. Pain increases with standing up straight. Pain alleviated with bending knees when standing straight and being in forward flexed position. Has tried IBU.       Past Medical History:  Diagnosis Date  . History of varicella   . Hypertension    no meds   Past Surgical History:  Procedure Laterality Date  . CESAREAN SECTION N/A 09/20/2016   Procedure: CESAREAN SECTION;  Surgeon: Harold Hedge, MD;  Location: [facility];  Service: Obstetrics;  Laterality: N/A;   Social History   Socioeconomic History  . Marital status: Married    Spouse name: Not on file  . Number of children: Not on file  . Years  of education: Not on file  . Highest education level: Not on file  Occupational History  . Not on file  Tobacco Use  . Smoking status: Never Smoker  . Smokeless tobacco: Never Used  Substance and Sexual Activity  . Alcohol use: No    Comment: occ  . Drug use: No  . Sexual activity: Not on file  Other Topics Concern  . Not on file  Social History Narrative  . Not on file   Social Determinants of Health   Financial Resource Strain:   . Difficulty of Paying Living Expenses: Not on file  Food Insecurity:   . Worried About Programme researcher, broadcasting/film/video in the Last Year: Not on file  . Ran Out of Food in the Last Year: Not on file  Transportation Needs:   . Lack of Transportation (Medical): Not on file  . Lack of Transportation (Non-Medical): Not on file  Physical Activity:   . Days of Exercise per Week: Not on file  . Minutes of Exercise per Session: Not on file  Stress:   . Feeling of Stress : Not on file  Social Connections:   . Frequency of Communication with Friends and Family: Not on file  . Frequency of Social Gatherings with Friends and Family: Not on file  . Attends Religious Services: Not on file  . Active Member of Clubs or Organizations: Not on file  . Attends Banker Meetings: Not on file  . Marital Status: Not on file   No Known Allergies Family History  Problem  Relation Age of Onset  . Hypertension Mother   . Hypertension Father   . Other Father        myelofibrosis  . Hypertension Sister   . Diabetes Maternal Aunt   . Stroke Maternal Aunt   . Stroke Maternal Uncle     Current Outpatient Medications (Endocrine & Metabolic):  .  levonorgestrel (KYLEENA) 19.5 MG IUD, by Intrauterine route once.      Current Outpatient Medications (Other):  Marland Kitchen  Omega-3 Fatty Acids (FISH OIL) 1000 MG CAPS, Take 1 capsule by mouth daily.   Reviewed prior external information including notes and imaging from  primary care provider As well as notes that were  available from care everywhere and other healthcare systems.  Past medical history, social, surgical and family history all reviewed in electronic medical record.  No pertanent information unless stated regarding to the chief complaint.   Review of Systems:  No headache, visual changes, nausea, vomiting, diarrhea, constipation, dizziness, abdominal pain, skin rash, fevers, chills, night sweats, weight loss, swollen lymph nodes, body aches, joint swelling, chest pain, shortness of breath, mood changes. POSITIVE muscle aches  Objective  Blood pressure 122/86, pulse 74, height 5\' 4"  (1.626 m), weight 202 lb (91.6 kg), SpO2 99 %, unknown if currently breastfeeding.   General: No apparent distress alert and oriented x3 mood and affect normal, dressed appropriately.  HEENT: Pupils equal, extraocular movements intact  Respiratory: Patient's speak in full sentences and does not appear short of breath  Cardiovascular: No lower extremity edema, non tender, no erythema  Neuro: Cranial nerves II through XII are intact, neurovascularly intact in all extremities with 2+ DTRs and 2+ pulses.  Gait normal with good balance and coordination.  MSK:  Non tender with full range of motion and good stability and symmetric strength and tone of shoulders, elbows, wrist, hip, knee and ankles bilaterally.  Low back exam does have some loss of lordosis, some tenderness to palpation in the paraspinal musculature right greater than left.  Mild tightness with on the right compared to left.  Worsening discomfort with extension.  No radicular symptoms at the moment though.   Osteopathic findings  T6 extended rotated and side bent left L4 flexed rotated and side bent left Sacrum right on right    Impression and Recommendations:     The above documentation has been reviewed and is accurate and complete [name], DO

## 2020-07-08 ENCOUNTER — Ambulatory Visit: Payer: BC Managed Care – PPO | Admitting: Family Medicine

## 2020-07-08 ENCOUNTER — Other Ambulatory Visit: Payer: Self-pay

## 2020-07-08 ENCOUNTER — Encounter: Payer: Self-pay | Admitting: Family Medicine

## 2020-07-08 VITALS — BP 120/84 | HR 69 | Ht 64.0 in | Wt 200.0 lb

## 2020-07-08 DIAGNOSIS — M999 Biomechanical lesion, unspecified: Secondary | ICD-10-CM

## 2020-07-08 DIAGNOSIS — M4316 Spondylolisthesis, lumbar region: Secondary | ICD-10-CM | POA: Diagnosis not present

## 2020-07-08 NOTE — Assessment & Plan Note (Signed)
Patient is doing remarkably better at this time.  Encourage her to continue the vitamin D.  Responded extremely well to osteopathic manipulation.  Follow-up with me again in 6 to 8 weeks

## 2020-07-08 NOTE — Patient Instructions (Addendum)
Good to see you Doing awesome! Keep it up! See me again in 6-8 weeks

## 2020-07-08 NOTE — Progress Notes (Signed)
[name] [address] [place] [address] Phone: [phone] Subjective:   I Kathy Dixon am serving as a Neurosurgeon for Dr. Antoine Primas.  This visit occurred during the SARS-CoV-2 public health emergency.  Safety protocols were in place, including screening questions prior to the visit, additional usage of staff PPE, and extensive cleaning of exam room while observing appropriate contact time as indicated for disinfecting solutions.   I'm seeing this patient by the request  of:  [name], FNP  CC: Low back and neck pain follow-up  ELF:[id number]  Kynedi N Faucett is a 39 y.o. female coming in with complaint of back pain. OMT 06/03/2020. Patient states she is doing better. Has been doing the stretches. Trying to eat better and exercise.  Patient is encouraged and feels like she is doing much better overall.  Patient still has some mild discomfort but nothing that is stopping her from activity, trying to stay and more active.  Medications patient has been prescribed:   Taking:      05/02/2020 IMPRESSION: Lumbar spine numbered the lowest segmented appearing lumbar shaped vertebrae on lateral view as L5. 9 mm anterolisthesis L4 on L5. No acute bony abnormality identified. Exam appears stable from prior study of 07/11/2019.  This was independently visualized and interpreted by me as well.   Reviewed prior external information including notes and imaging from previsou exam, outside providers and external EMR if available.   As well as notes that were available from care everywhere and other healthcare systems.  Past medical history, social, surgical and family history all reviewed in electronic medical record.  No pertanent information unless stated regarding to the chief complaint.   Past Medical History:  Diagnosis Date  . History of varicella   . Hypertension    no meds    No Known Allergies   Review of Systems:  No  headache, visual changes, nausea, vomiting, diarrhea, constipation, dizziness, abdominal pain, skin rash, fevers, chills, night sweats, weight loss, swollen lymph nodes, body aches, joint swelling, chest pain, shortness of breath, mood changes. POSITIVE muscle aches  Objective  unknown if currently breastfeeding.   General: No apparent distress alert and oriented x3 mood and affect normal, dressed appropriately.  HEENT: Pupils equal, extraocular movements intact  Respiratory: Patient's speak in full sentences and does not appear short of breath  Cardiovascular: No lower extremity edema, non tender, no erythema  Neuro: Cranial nerves II through XII are intact, neurovascularly intact in all extremities with 2+ DTRs and 2+ pulses.  Gait normal with good balance and coordination.  MSK:  Non tender with full range of motion and good stability and symmetric strength and tone of shoulders, elbows, wrist, hip, knee and ankles bilaterally.  Back -low back exam still some still some tightness around the sacroiliac joint.  Mild in the thoracolumbar juncture as well.  Negative straight leg test.  Mild tightness with FABER test.  5-5 strength in lower extremities  Osteopathic findings   T9 extended rotated and side bent left L2 flexed rotated and side bent right Sacrum right on right       Assessment and Plan:  Spondylolisthesis at L4-L5 level Patient is doing remarkably better at this time.  Encourage her to continue the vitamin D.  Responded extremely well to osteopathic manipulation.  Follow-up with me again in 6 to 8 weeks    Nonallopathic problems  Decision today to treat with OMT was based on Physical Exam  After  verbal consent patient was treated with HVLA, ME, FPR techniques in  thoracic, lumbar, and sacral  areas  Patient tolerated the procedure well with improvement in symptoms  Patient given exercises, stretches and lifestyle modifications  See medications in patient  instructions if given  Patient will follow up in 4-8 weeks      The above documentation has been reviewed and is accurate and complete Judi Saa, DO       Note: This dictation was prepared with Dragon dictation along with smaller phrase technology. Any transcriptional errors that result from this process are unintentional.

## 2020-08-29 NOTE — Progress Notes (Unsigned)
[name] [address] [place] [address] Phone: [phone] Subjective:   Kathy Dixon, am serving as a scribe for Dr. Antoine Primas. This visit occurred during the SARS-CoV-2 public health emergency.  Safety protocols were in place, including screening questions prior to the visit, additional usage of staff PPE, and extensive cleaning of exam room while observing appropriate contact time as indicated for disinfecting solutions.   I'm seeing this patient by the request  of:  [name], FNP  CC: Neck and back pain follow-up  ION:[id number]  Cabrini N Tamburri is a 40 y.o. female coming in with complaint of back and neck pain. OMT 07/08/2020. Patient states that she does still have numbness in left but is doing much better. Is going to the gym and gaining strength.  Mild recent left knee pain.  Seems to be more on the kneecap.  Little more noisy than true pain.  No instability.  Medications patient has been prescribed: Only vitamins          Reviewed prior external information including notes and imaging from previsou exam, outside providers and external EMR if available.   As well as notes that were available from care everywhere and other healthcare systems.  Past medical history, social, surgical and family history all reviewed in electronic medical record.  No pertanent information unless stated regarding to the chief complaint.   Past Medical History:  Diagnosis Date  . History of varicella   . Hypertension    no meds    No Known Allergies   Review of Systems:  No headache, visual changes, nausea, vomiting, diarrhea, constipation, dizziness, abdominal pain, skin rash, fevers, chills, night sweats, weight loss, swollen lymph nodes, body aches, joint swelling, chest pain, shortness of breath, mood changes. POSITIVE muscle aches  Objective  Blood pressure 116/84, pulse 75, height 5\' 4"  (1.626 m), weight 198 lb (89.8 kg),  SpO2 99 %, unknown if currently breastfeeding.   General: No apparent distress alert and oriented x3 mood and affect normal, dressed appropriately.  HEENT: Pupils equal, extraocular movements intact  Respiratory: Patient's speak in full sentences and does not appear short of breath  Cardiovascular: No lower extremity edema, non tender, no erythema  Left knee exam shows some very mild lateral tracking of the patella.  Negative patellar grind test noted.  Patient has good stability of the knee overall.  Back exam still shows some mild tightness noted of the sacroiliac joint.  Patient has mild improvement in core strength.  Some mild discomfort over the paraspinal musculature of the lumbar spine right greater than left   Osteopathic findings  T9 extended rotated and side bent left L2 flexed rotated and side bent right Sacrum right on right       Assessment and Plan:  Spondylolisthesis at L4-L5 level Patient does have some degenerative disc disease but is responding extremely well to osteopathic manipulation.  Patient continues to workout on a regular basis and is trying to lose weight.  Patient has done relatively well at this time.  We discussed posture and ergonomics.  Discussed different changes that she can do secondary to the mild knee pain.  Follow-up again in 6 to 12 weeks  Patellofemoral disorder, left Mild overall.  Start with home exercises.  Worsening symptoms x-rays and physical therapy and bracing may be necessary    Nonallopathic problems  Decision today to treat with OMT was based on Physical Exam  After verbal consent patient was  treated with HVLA, ME, FPR techniques in  thoracic, lumbar, and sacral  areas  Patient tolerated the procedure well with improvement in symptoms  Patient given exercises, stretches and lifestyle modifications  See medications in patient instructions if given  Patient will follow up in 8-12 weeks      The above documentation has  been reviewed and is accurate and complete Judi Saa, DO       Note: This dictation was prepared with Dragon dictation along with smaller phrase technology. Any transcriptional errors that result from this process are unintentional.

## 2020-09-01 ENCOUNTER — Ambulatory Visit: Payer: BC Managed Care – PPO | Admitting: Family Medicine

## 2020-09-01 ENCOUNTER — Other Ambulatory Visit: Payer: Self-pay

## 2020-09-01 ENCOUNTER — Encounter: Payer: Self-pay | Admitting: Family Medicine

## 2020-09-01 VITALS — BP 116/84 | HR 75 | Ht 64.0 in | Wt 198.0 lb

## 2020-09-01 DIAGNOSIS — M4316 Spondylolisthesis, lumbar region: Secondary | ICD-10-CM | POA: Diagnosis not present

## 2020-09-01 DIAGNOSIS — M999 Biomechanical lesion, unspecified: Secondary | ICD-10-CM | POA: Diagnosis not present

## 2020-09-01 DIAGNOSIS — M222X2 Patellofemoral disorders, left knee: Secondary | ICD-10-CM | POA: Diagnosis not present

## 2020-09-01 NOTE — Assessment & Plan Note (Signed)
Patient does have some degenerative disc disease but is responding extremely well to osteopathic manipulation.  Patient continues to workout on a regular basis and is trying to lose weight.  Patient has done relatively well at this time.  We discussed posture and ergonomics.  Discussed different changes that she can do secondary to the mild knee pain.  Follow-up again in 6 to 12 weeks

## 2020-09-01 NOTE — Assessment & Plan Note (Signed)
Mild overall.  Start with home exercises.  Worsening symptoms x-rays and physical therapy and bracing may be necessary

## 2020-09-01 NOTE — Patient Instructions (Signed)
Great to see you You are doing amazing Keep up good work No front squats or deep squats See me again in 3 months

## 2020-11-27 NOTE — Progress Notes (Signed)
[facility] [address] [place] [address] Phone: [phone] Subjective:   Kathy Dixon, am serving as a scribe for Dr. Antoine Dixon. This visit occurred during the SARS-CoV-2 public health emergency.  Safety protocols were in place, including screening questions prior to the visit, additional usage of staff PPE, and extensive cleaning of exam room while observing appropriate contact time as indicated for disinfecting solutions.   I'm seeing this patient by the request  of:  Kathy Bass, FNP  CC:   [id number]  Kathy Dixon is a 40 y.o. female coming in with complaint of back and neck pain. OMT 09/01/2020. Patient states that she is doing better than last visit. Numbness is not as pronounced in L leg. Does have achiness in glutes intermittently. Overall feels like she is improving.   Medications patient has been prescribed: None Taking:         Reviewed prior external information including notes and imaging from previsou exam, outside providers and external EMR if available.   As well as notes that were available from care everywhere and other healthcare systems.  Past medical history, social, surgical and family history all reviewed in electronic medical record.  No pertanent information unless stated regarding to the chief complaint.   Past Medical History:  Diagnosis Date  . History of varicella   . Hypertension    no meds    No Known Allergies   Review of Systems:  No headache, visual changes, nausea, vomiting, diarrhea, constipation, dizziness, abdominal pain, skin rash, fevers, chills, night sweats, weight loss, swollen lymph nodes, body aches, joint swelling, chest pain, shortness of breath, mood changes. POSITIVE muscle aches  Objective  Blood pressure 106/78, pulse 81, height 5\' 4"  (1.626 m), weight 198 lb (89.8 kg), SpO2 99 %, unknown if currently breastfeeding.   General: No apparent distress alert and  oriented x3 mood and affect normal, dressed appropriately.  HEENT: Pupils equal, extraocular movements intact  Respiratory: Patient's speak in full sentences and does not appear short of breath  Cardiovascular: No lower extremity edema, non tender, no erythema  Gait normal with good balance and coordination.  MSK:  Non tender with full range of motion and good stability and symmetric strength and tone of shoulders, elbows, wrist, hip, knee and ankles bilaterally.  Back -   Osteopathic findings  C6 flexed rotated and side bent left T9 extended rotated and side bent left L2 flexed rotated and side bent right Sacrum right on right       Assessment and Plan:  Spondylolisthesis at L4-L5 level Patient is doing well overall.  Discussed with patient icing regimen and home exercises.  Patient is doing better and is strengthening.  Encouraged her to continue the home exercises.  Not taking any medications.  Follow-up again 2 months    Nonallopathic problems  Decision today to treat with OMT was based on Physical Exam  After verbal consent patient was treated with HVLA, ME, FPR techniques in cervical, , thoracic, lumbar, and sacral  areas  Patient tolerated the procedure well with improvement in symptoms  Patient given exercises, stretches and lifestyle modifications  See medications in patient instructions if given  Patient will follow up in 4-8 weeks      The above documentation has been reviewed and is accurate and complete , DO       Note: This dictation was prepared with Dragon dictation along with smaller phrase technology. Any transcriptional errors  that result from this process are unintentional.

## 2020-12-01 ENCOUNTER — Ambulatory Visit: Payer: BC Managed Care – PPO | Admitting: Family Medicine

## 2020-12-02 ENCOUNTER — Encounter: Payer: Self-pay | Admitting: Family Medicine

## 2020-12-02 ENCOUNTER — Other Ambulatory Visit: Payer: Self-pay

## 2020-12-02 ENCOUNTER — Ambulatory Visit: Payer: BC Managed Care – PPO | Admitting: Family Medicine

## 2020-12-02 VITALS — BP 106/78 | HR 81 | Ht 64.0 in | Wt 198.0 lb

## 2020-12-02 DIAGNOSIS — M4316 Spondylolisthesis, lumbar region: Secondary | ICD-10-CM

## 2020-12-02 DIAGNOSIS — M9903 Segmental and somatic dysfunction of lumbar region: Secondary | ICD-10-CM | POA: Diagnosis not present

## 2020-12-02 DIAGNOSIS — M9902 Segmental and somatic dysfunction of thoracic region: Secondary | ICD-10-CM

## 2020-12-02 DIAGNOSIS — M9901 Segmental and somatic dysfunction of cervical region: Secondary | ICD-10-CM | POA: Diagnosis not present

## 2020-12-02 NOTE — Assessment & Plan Note (Signed)
Patient is doing well overall.  Discussed with patient icing regimen and home exercises.  Patient is doing better and is strengthening.  Encouraged her to continue the home exercises.  Not taking any medications.  Follow-up again 2 months

## 2020-12-02 NOTE — Patient Instructions (Signed)
Doing great overall Much stronger than last time See me in 7-8 weeks

## 2020-12-31 ENCOUNTER — Ambulatory Visit: Payer: BC Managed Care – PPO | Attending: Internal Medicine

## 2020-12-31 DIAGNOSIS — Z20822 Contact with and (suspected) exposure to covid-19: Secondary | ICD-10-CM

## 2021-01-01 LAB — SARS-COV-2, NAA 2 DAY TAT

## 2021-01-01 LAB — NOVEL CORONAVIRUS, NAA: SARS-CoV-2, NAA: NOT DETECTED

## 2021-01-27 NOTE — Progress Notes (Signed)
[name] [address] [place] [address] Phone: [phone] Subjective:   [name], am serving as a scribe for Dr. Antoine Primas. This visit occurred during the SARS-CoV-2 public health emergency.  Safety protocols were in place, including screening questions prior to the visit, additional usage of staff PPE, and extensive cleaning of exam room while observing appropriate contact time as indicated for disinfecting solutions.   I'm seeing this patient by the request  of:  [name], FNP  CC: Neck pain follow-up  ION:[id number]  Kathy Dixon is a 40 y.o. female coming in with complaint of back and neck pain. OMT 12/02/2020. Patient states that she is trying to figure out when to workout due to being back in the office. Did walk a lot over [facility] Day and L leg was numb.  Patient states that the back pain seems to be minorly constant.  Patient states that the radicular symptoms are very intermittent and stops with certain activities or stretching.  Medications patient has been prescribed: None  Taking:     IMPRESSION: Lumbar spine numbered the lowest segmented appearing lumbar shaped vertebrae on lateral view as L5. 9 mm anterolisthesis L4 on L5. No acute bony abnormality identified. Exam appears stable from prior study of 07/11/2019.    Past Medical History:  Diagnosis Date  . History of varicella   . Hypertension    no meds    No Known Allergies   Review of Systems:  No headache, visual changes, nausea, vomiting, diarrhea, constipation, dizziness, abdominal pain, skin rash, fevers, chills, night sweats, weight loss, swollen lymph nodes, body aches, joint swelling, chest pain, shortness of breath, mood changes. POSITIVE muscle aches  Objective  Blood pressure 106/70, pulse 88, height 5\' 4"  (1.626 m), weight 200 lb (90.7 kg), SpO2 98 %, unknown if currently breastfeeding.   General: No apparent distress alert and  oriented x3 mood and affect normal, dressed appropriately.  HEENT: Pupils equal, extraocular movements intact  Respiratory: Patient's speak in full sentences and does not appear short of breath  Cardiovascular: No lower extremity edema, non tender, no erythema  Low back exam does have some mild loss of lordosis.  Some tenderness to palpation in the paraspinal musculature.  More tenderness at the L4-L5.  Right greater than left.  Osteopathic findings  C6 flexed rotated and side bent left T9 extended rotated and side bent left L2 flexed rotated and side bent right Sacrum right on right       Assessment and Plan:  Spondylolisthesis at L4-L5 level Chronic problem with stable.  Patient is doing relatively well.  He does have the cholelithiasis we will need to continue to monitor.  Patient does have intermittent radicular symptoms we will monitor as well.  Patient is responding well to manipulation.  Discussed posture and ergonomics.  Follow-up with me again in 6 to 8 weeks    Nonallopathic problems  Decision today to treat with OMT was based on Physical Exam  After verbal consent patient was treated with HVLA, ME, FPR techniques in cervical,  thoracic, lumbar, and sacral  areas  Patient tolerated the procedure well with improvement in symptoms  Patient given exercises, stretches and lifestyle modifications  See medications in patient instructions if given  Patient will follow up in 4-8 weeks      The above documentation has been reviewed and is accurate and complete , DO       Note: This dictation  was prepared with Dragon dictation along with smaller phrase technology. Any transcriptional errors that result from this process are unintentional.

## 2021-01-28 ENCOUNTER — Other Ambulatory Visit: Payer: Self-pay

## 2021-01-28 ENCOUNTER — Ambulatory Visit: Payer: BC Managed Care – PPO | Admitting: Family Medicine

## 2021-01-28 ENCOUNTER — Encounter: Payer: Self-pay | Admitting: Family Medicine

## 2021-01-28 VITALS — BP 106/70 | HR 88 | Ht 64.0 in | Wt 200.0 lb

## 2021-01-28 DIAGNOSIS — M9902 Segmental and somatic dysfunction of thoracic region: Secondary | ICD-10-CM | POA: Diagnosis not present

## 2021-01-28 DIAGNOSIS — M9904 Segmental and somatic dysfunction of sacral region: Secondary | ICD-10-CM

## 2021-01-28 DIAGNOSIS — M9903 Segmental and somatic dysfunction of lumbar region: Secondary | ICD-10-CM | POA: Diagnosis not present

## 2021-01-28 DIAGNOSIS — M9901 Segmental and somatic dysfunction of cervical region: Secondary | ICD-10-CM | POA: Diagnosis not present

## 2021-01-28 DIAGNOSIS — M4316 Spondylolisthesis, lumbar region: Secondary | ICD-10-CM

## 2021-01-28 NOTE — Assessment & Plan Note (Signed)
Chronic problem with stable.  Patient is doing relatively well.  He does have the cholelithiasis we will need to continue to monitor.  Patient does have intermittent radicular symptoms we will monitor as well.  Patient is responding well to manipulation.  Discussed posture and ergonomics.  Follow-up with me again in 6 to 8 weeks

## 2021-01-28 NOTE — Patient Instructions (Signed)
Good to see you Find the balance with work Look into asymmetric training Exercises as cool down See me again in 6-8 weeks

## 2021-03-25 ENCOUNTER — Ambulatory Visit: Payer: BC Managed Care – PPO | Admitting: Family Medicine

## 2021-03-25 ENCOUNTER — Other Ambulatory Visit: Payer: Self-pay

## 2021-03-25 ENCOUNTER — Encounter: Payer: Self-pay | Admitting: Family Medicine

## 2021-03-25 DIAGNOSIS — M4316 Spondylolisthesis, lumbar region: Secondary | ICD-10-CM | POA: Diagnosis not present

## 2021-03-25 DIAGNOSIS — M999 Biomechanical lesion, unspecified: Secondary | ICD-10-CM

## 2021-03-25 NOTE — Patient Instructions (Signed)
Let's see you again in 3 months

## 2021-03-25 NOTE — Assessment & Plan Note (Signed)
Patient is feeling much better overall.  Mild discomfort from time to time but nothing severe.  We will space patient's intervals to 3 months at this moment.  Patient will continue to be active otherwise.  If any other significant concerns patient can see me sooner.

## 2021-03-25 NOTE — Progress Notes (Signed)
[name] [address] [place] [address] Phone: [phone] Subjective:   [name], am serving as a scribe for Dr. Antoine Primas.  This visit occurred during the SARS-CoV-2 public health emergency.  Safety protocols were in place, including screening questions prior to the visit, additional usage of staff PPE, and extensive cleaning of exam room while observing appropriate contact time as indicated for disinfecting solutions.   I'm seeing this patient by the request  of:  [name], FNP  CC: Low back pain follow-up  [id number]  Kathy Dixon is a 40 y.o. female coming in with complaint of back and neck pain. OMT 01/28/2021. Patient states that she has been doing well. No back pain since last visit.   Medications patient has been prescribed: None          Reviewed prior external information including notes and imaging from previsou exam, outside providers and external EMR if available.   As well as notes that were available from care everywhere and other healthcare systems.  Past medical history, social, surgical and family history all reviewed in electronic medical record.  No pertanent information unless stated regarding to the chief complaint.   Past Medical History:  Diagnosis Date   History of varicella    Hypertension    no meds    No Known Allergies   Review of Systems:  No headache, visual changes, nausea, vomiting, diarrhea, constipation, dizziness, abdominal pain, skin rash, fevers, chills, night sweats, weight loss, swollen lymph nodes, body aches, joint swelling, chest pain, shortness of breath, mood changes.  Objective  Blood pressure 102/74, pulse 82, height 5\' 4"  (1.626 m), weight 198 lb (89.8 kg), peak flow 98 L/min, unknown if currently breastfeeding.   General: No apparent distress alert and oriented x3 mood and affect normal, dressed appropriately.  HEENT: Pupils equal, extraocular  movements intact  Respiratory: Patient's speak in full sentences and does not appear short of breath  Cardiovascular: No lower extremity edema, non tender, no erythema  Low back exam does very well.  Some mild discomfort noted in the paraspinal musculature of the lumbar spine on the right side more than left.  Very mild pain mostly in the L4-L5 area and mild over the sacroiliac joint.  Negative straight leg test.  Osteopathic findings  T9 extended rotated and side bent left L2 flexed rotated and side bent right Sacrum right on right       Assessment and Plan:  Spondylolisthesis at L4-L5 level Patient is feeling much better overall.  Mild discomfort from time to time but nothing severe.  We will space patient's intervals to 3 months at this moment.  Patient will continue to be active otherwise.  If any other significant concerns patient can see me sooner.   Nonallopathic problems  Decision today to treat with OMT was based on Physical Exam  After verbal consent patient was treated with HVLA, ME, FPR techniques in  thoracic, lumbar, and sacral  areas  Patient tolerated the procedure well with improvement in symptoms  Patient given exercises, stretches and lifestyle modifications  See medications in patient instructions if given  Patient will follow up in 12 weeks      The above documentation has been reviewed and is accurate and complete , DO        Note: This dictation was prepared with Dragon dictation along with smaller phrase technology. Any transcriptional errors that result from this process are unintentional.

## 2021-06-12 NOTE — Progress Notes (Deleted)
  [name] [address] [place] [address] Phone: [phone] Subjective:    I'm seeing this patient by the request  of:  [name], FNP  CC:   [id number]  Kathy Dixon is a 40 y.o. female coming in with complaint of back and neck pain. OMT 03/25/2021. Patient states   Medications patient has been prescribed: None  Taking:         Reviewed prior external information including notes and imaging from previsou exam, outside providers and external EMR if available.   As well as notes that were available from care everywhere and other healthcare systems.  Past medical history, social, surgical and family history all reviewed in electronic medical record.  No pertanent information unless stated regarding to the chief complaint.   Past Medical History:  Diagnosis Date   History of varicella    Hypertension    no meds    No Known Allergies   Review of Systems:  No headache, visual changes, nausea, vomiting, diarrhea, constipation, dizziness, abdominal pain, skin rash, fevers, chills, night sweats, weight loss, swollen lymph nodes, body aches, joint swelling, chest pain, shortness of breath, mood changes. POSITIVE muscle aches  Objective  unknown if currently breastfeeding.   General: No apparent distress alert and oriented x3 mood and affect normal, dressed appropriately.  HEENT: Pupils equal, extraocular movements intact  Respiratory: Patient's speak in full sentences and does not appear short of breath  Cardiovascular: No lower extremity edema, non tender, no erythema  Neuro: Cranial nerves II through XII are intact, neurovascularly intact in all extremities with 2+ DTRs and 2+ pulses.  Gait normal with good balance and coordination.  MSK:  Non tender with full range of motion and good stability and symmetric strength and tone of shoulders, elbows, wrist, hip, knee and ankles bilaterally.  Back - Normal skin, Spine  with normal alignment and no deformity.  No tenderness to vertebral process palpation.  Paraspinous muscles are not tender and without spasm.   Range of motion is full at neck and lumbar sacral regions  Osteopathic findings  C2 flexed rotated and side bent right C6 flexed rotated and side bent left T3 extended rotated and side bent right inhaled rib T9 extended rotated and side bent left L2 flexed rotated and side bent right Sacrum right on right       Assessment and Plan:    Nonallopathic problems  Decision today to treat with OMT was based on Physical Exam  After verbal consent patient was treated with HVLA, ME, FPR techniques in cervical, rib, thoracic, lumbar, and sacral  areas  Patient tolerated the procedure well with improvement in symptoms  Patient given exercises, stretches and lifestyle modifications  See medications in patient instructions if given  Patient will follow up in 4-8 weeks      The above documentation has been reviewed and is accurate and complete Wilford Grist       Note: This dictation was prepared with Dragon dictation along with smaller phrase technology. Any transcriptional errors that result from this process are unintentional.

## 2021-06-23 ENCOUNTER — Ambulatory Visit: Payer: BC Managed Care – PPO | Admitting: Family Medicine

## 2021-07-24 ENCOUNTER — Telehealth: Payer: Self-pay

## 2021-07-24 NOTE — Telephone Encounter (Signed)
Caller would she would like to schedule annual visit.  Telephone: [phone]

## 2021-07-24 NOTE — Progress Notes (Deleted)
  [name] [address] [place] [address] Phone: [phone] Subjective:    I'm seeing this patient by the request  of:  [name], FNP  CC:   [id number]  Kathy Dixon is a 40 y.o. female coming in with complaint of back and neck pain. OMT 03/25/2021. Patient states   Medications patient has been prescribed: None  Taking:         Reviewed prior external information including notes and imaging from previsou exam, outside providers and external EMR if available.   As well as notes that were available from care everywhere and other healthcare systems.  Past medical history, social, surgical and family history all reviewed in electronic medical record.  No pertanent information unless stated regarding to the chief complaint.   Past Medical History:  Diagnosis Date   History of varicella    Hypertension    no meds    No Known Allergies   Review of Systems:  No headache, visual changes, nausea, vomiting, diarrhea, constipation, dizziness, abdominal pain, skin rash, fevers, chills, night sweats, weight loss, swollen lymph nodes, body aches, joint swelling, chest pain, shortness of breath, mood changes. POSITIVE muscle aches  Objective  unknown if currently breastfeeding.   General: No apparent distress alert and oriented x3 mood and affect normal, dressed appropriately.  HEENT: Pupils equal, extraocular movements intact  Respiratory: Patient's speak in full sentences and does not appear short of breath  Cardiovascular: No lower extremity edema, non tender, no erythema  Neuro: Cranial nerves II through XII are intact, neurovascularly intact in all extremities with 2+ DTRs and 2+ pulses.  Gait normal with good balance and coordination.  MSK:  Non tender with full range of motion and good stability and symmetric strength and tone of shoulders, elbows, wrist, hip, knee and ankles bilaterally.  Back - Normal skin, Spine  with normal alignment and no deformity.  No tenderness to vertebral process palpation.  Paraspinous muscles are not tender and without spasm.   Range of motion is full at neck and lumbar sacral regions  Osteopathic findings  C2 flexed rotated and side bent right C6 flexed rotated and side bent left T3 extended rotated and side bent right inhaled rib T9 extended rotated and side bent left L2 flexed rotated and side bent right Sacrum right on right       Assessment and Plan:    Nonallopathic problems  Decision today to treat with OMT was based on Physical Exam  After verbal consent patient was treated with HVLA, ME, FPR techniques in cervical, rib, thoracic, lumbar, and sacral  areas  Patient tolerated the procedure well with improvement in symptoms  Patient given exercises, stretches and lifestyle modifications  See medications in patient instructions if given  Patient will follow up in 4-8 weeks      The above documentation has been reviewed and is accurate and complete Wilford Grist       Note: This dictation was prepared with Dragon dictation along with smaller phrase technology. Any transcriptional errors that result from this process are unintentional.

## 2021-07-24 NOTE — Telephone Encounter (Signed)
I have called pt and scheduled to make an appointment. There was no answers so I left a message to call back.

## 2021-07-27 ENCOUNTER — Ambulatory Visit: Payer: BC Managed Care – PPO | Admitting: Family Medicine

## 2021-07-30 ENCOUNTER — Telehealth (INDEPENDENT_AMBULATORY_CARE_PROVIDER_SITE_OTHER): Payer: BC Managed Care – PPO | Admitting: Family

## 2021-07-30 DIAGNOSIS — J069 Acute upper respiratory infection, unspecified: Secondary | ICD-10-CM

## 2021-07-30 NOTE — Progress Notes (Signed)
  Kathy Dixon is a 40 y.o. female with the following history as recorded in [facility]:  Patient Active Problem List   Diagnosis Date Noted   Patellofemoral disorder, left 09/01/2020   Spondylolisthesis at L4-L5 level 06/03/2020   Nonallopathic lesion of lumbar region 06/03/2020   Normal labor and delivery 09/20/2016    Current Outpatient Medications  Medication Sig Dispense Refill   levonorgestrel (KYLEENA) 19.5 MG IUD by Intrauterine route once.     Omega-3 Fatty Acids (FISH OIL) 1000 MG CAPS Take 1 capsule by mouth daily.     No current facility-administered medications for this visit.    Allergies: Patient has no known allergies.  Past Medical History:  Diagnosis Date   History of varicella    Hypertension    no meds    Past Surgical History:  Procedure Laterality Date   CESAREAN SECTION N/A 09/20/2016   Procedure: CESAREAN SECTION;  Surgeon: Harold Hedge, MD;  Location: [facility] BIR[facility] SUIT[facility];  Service: Obstetrics;  Laterality: N/A;    Family History  Problem Relation Age of Onset   Hypertension Mother    Hypertension Father    Other Father        myelofibrosis   Hypertension Sister    Diabetes Maternal Aunt    Stroke Maternal Aunt    Stroke Maternal Uncle     Social History   Tobacco Use   Smoking status: Never   Smokeless tobacco: Never  Substance Use Topics   Alcohol use: No    Comment: occ    Subjective:   I connected with Kathy Dixon on 07/30/21 at  9:40 AM EST by a video enabled telemedicine application and verified that I am speaking with the correct person using two identifiers.   I discussed the limitations of evaluation and management by telemedicine and the availability of in person appointments. The patient expressed understanding and agreed to proceed. Provider in office/ patient is at home; provider and patient are only 2 people on video call. Audio did fail on call and had to finish by phone.   Started with flu like symptoms on Sunday with body  aches/ fever/ nausea and vomiting; notes that by yesterday her symptoms had improved except for lingering congestion; last fever was Tuesday pm; using OTC Dayquil and honey cough syrup; daughter had similar symptoms last week;   Objective:  There were no vitals filed for this visit.  General: Well developed, well nourished, in no acute distress  Skin : Warm and dry.  Head: Normocephalic and atraumatic  Lungs: Respirations unlabored;  Neurologic: Alert and oriented; speech intact; face symmetrical;   1. Viral URI with cough     Plan:  Patient is improving; symptomatic management discussed; increase fluids, rest and follow up worse, no better.  Keep planned appointment for in office CPE next week;   No follow-ups on file.  No orders of the defined types were placed in this encounter.   Requested Prescriptions    No prescriptions requested or ordered in this encounter

## 2021-08-07 ENCOUNTER — Encounter: Payer: Self-pay | Admitting: Family

## 2021-08-07 ENCOUNTER — Ambulatory Visit (INDEPENDENT_AMBULATORY_CARE_PROVIDER_SITE_OTHER): Payer: BC Managed Care – PPO | Admitting: Family

## 2021-08-07 VITALS — BP 124/80 | HR 81 | Temp 98.3°F | Ht 63.0 in | Wt 203.6 lb

## 2021-08-07 DIAGNOSIS — Z0000 Encounter for general adult medical examination without abnormal findings: Secondary | ICD-10-CM | POA: Diagnosis not present

## 2021-08-07 DIAGNOSIS — Z13220 Encounter for screening for lipoid disorders: Secondary | ICD-10-CM

## 2021-08-07 DIAGNOSIS — E538 Deficiency of other specified B group vitamins: Secondary | ICD-10-CM

## 2021-08-07 DIAGNOSIS — Z23 Encounter for immunization: Secondary | ICD-10-CM

## 2021-08-07 DIAGNOSIS — R635 Abnormal weight gain: Secondary | ICD-10-CM

## 2021-08-07 LAB — CBC WITH DIFFERENTIAL/PLATELET
Basophils Absolute: 0 10*3/uL (ref 0.0–0.1)
Basophils Relative: 0.4 % (ref 0.0–3.0)
Eosinophils Absolute: 0.1 10*3/uL (ref 0.0–0.7)
Eosinophils Relative: 1.4 % (ref 0.0–5.0)
HCT: 40.5 % (ref 36.0–46.0)
Hemoglobin: 13.2 g/dL (ref 12.0–15.0)
Lymphocytes Relative: 33.2 % (ref 12.0–46.0)
Lymphs Abs: 3.1 10*3/uL (ref 0.7–4.0)
MCHC: 32.5 g/dL (ref 30.0–36.0)
MCV: 91 fl (ref 78.0–100.0)
Monocytes Absolute: 0.7 10*3/uL (ref 0.1–1.0)
Monocytes Relative: 7.7 % (ref 3.0–12.0)
Neutro Abs: 5.4 10*3/uL (ref 1.4–7.7)
Neutrophils Relative %: 57.3 % (ref 43.0–77.0)
Platelets: 353 10*3/uL (ref 150.0–400.0)
RBC: 4.45 Mil/uL (ref 3.87–5.11)
RDW: 13.4 % (ref 11.5–15.5)
WBC: 9.4 10*3/uL (ref 4.0–10.5)

## 2021-08-07 LAB — LIPID PANEL
Cholesterol: 151 mg/dL (ref 0–200)
HDL: 49.6 mg/dL (ref >39.00)
LDL Cholesterol: 81 mg/dL (ref 0–99)
NonHDL: 101.04
Total CHOL/HDL Ratio: 3
Triglycerides: 100 mg/dL (ref 0.0–149.0)
VLDL: 20 mg/dL (ref 0.0–40.0)

## 2021-08-07 LAB — TSH: TSH: 1.61 u[IU]/mL (ref 0.35–5.50)

## 2021-08-07 LAB — COMPREHENSIVE METABOLIC PANEL
ALT: 22 U/L (ref 0–35)
AST: 15 U/L (ref 0–37)
Albumin: 4.2 g/dL (ref 3.5–5.2)
Alkaline Phosphatase: 49 U/L (ref 39–117)
BUN: 9 mg/dL (ref 6–23)
CO2: 29 mEq/L (ref 19–32)
Calcium: 9.2 mg/dL (ref 8.4–10.5)
Chloride: 100 mEq/L (ref 96–112)
Creatinine, Ser: 0.7 mg/dL (ref 0.40–1.20)
GFR: 107.22 mL/min (ref >60.00)
Glucose, Bld: 79 mg/dL (ref 70–99)
Potassium: 4.1 mEq/L (ref 3.5–5.1)
Sodium: 138 mEq/L (ref 135–145)
Total Bilirubin: 0.3 mg/dL (ref 0.2–1.2)
Total Protein: 7.7 g/dL (ref 6.0–8.3)

## 2021-08-07 LAB — VITAMIN B12: Vitamin B-12: 632 pg/mL (ref 211–911)

## 2021-08-07 LAB — HEMOGLOBIN A1C: Hgb A1c MFr Bld: 6.2 % (ref 4.6–6.5)

## 2021-08-07 NOTE — Progress Notes (Signed)
Kathy Dixon is a 40 y.o. female with the following history as recorded in [employer]:  Patient Active Problem List   Diagnosis Date Noted   Patellofemoral disorder, left 09/01/2020   Spondylolisthesis at L4-L5 level 06/03/2020   Nonallopathic lesion of lumbar region 06/03/2020   Normal labor and delivery 09/20/2016    Current Outpatient Medications  Medication Sig Dispense Refill   levonorgestrel (KYLEENA) 19.5 MG IUD by Intrauterine route once.     Pediatric Multivit-Minerals-C (MULTIVITAMIN CHILDRENS GUMMIES) CHEW multivitamin     No current facility-administered medications for this visit.    Allergies: Patient has no known allergies.  Past Medical History:  Diagnosis Date   History of varicella    Hypertension    no meds    Past Surgical History:  Procedure Laterality Date   CESAREAN SECTION N/A 09/20/2016   Procedure: CESAREAN SECTION;  Surgeon: Everlene Farrier, MD;  Location: [facility];  Service: Obstetrics;  Laterality: N/A;    Family History  Problem Relation Age of Onset   Hypertension Mother    Hypertension Father    Other Father        myelofibrosis   Hypertension Sister    Diabetes Maternal Aunt    Stroke Maternal Aunt    Stroke Maternal Uncle     Social History   Tobacco Use   Smoking status: Never   Smokeless tobacco: Never  Substance Use Topics   Alcohol use: No    Comment: occ    Subjective:  Presents for yearly CPE; is up to date with GYN needs;  Concerned about weight- feels like she hits a plateau even with regular exercise/ diet attempts;  Would like to get flu shot today;  Sees dental and eye doctor regularly;   Review of Systems  Constitutional: Negative.   HENT: Negative.    Eyes: Negative.   Respiratory: Negative.    Cardiovascular: Negative.   Gastrointestinal: Negative.   Genitourinary: Negative.   Musculoskeletal: Negative.   Skin: Negative.   Neurological: Negative.   Endo/Heme/Allergies: Negative.    Psychiatric/Behavioral: Negative.       Objective:  Vitals:   08/07/21 1304  BP: 124/80  Pulse: 81  Temp: 98.3 F (36.8 C)  TempSrc: Oral  SpO2: 99%  Weight: 203 lb 9.6 oz (92.4 kg)  Height: 5' 3"  (1.6 m)    General: Well developed, well nourished, in no acute distress  Skin : Warm and dry.  Head: Normocephalic and atraumatic  Eyes: Sclera and conjunctiva clear; pupils round and reactive to light; extraocular movements intact  Ears: External normal; canals clear; tympanic membranes normal  Oropharynx: Pink, supple. No suspicious lesions  Neck: Supple without thyromegaly, adenopathy  Lungs: Respirations unlabored; clear to auscultation bilaterally without wheeze, rales, rhonchi  CVS exam: normal rate and regular rhythm.  Abdomen: Soft; nontender; nondistended; normoactive bowel sounds; no masses or hepatosplenomegaly  Musculoskeletal: No deformities; no active joint inflammation  Extremities: No edema, cyanosis, clubbing  Vessels: Symmetric bilaterally  Neurologic: Alert and oriented; speech intact; face symmetrical; moves all extremities well; CNII-XII intact without focal deficit   Assessment:  1. PE (physical exam), annual   2. Lipid screening   3. Weight gain   4. Low vitamin B12 level   5. Need for immunization against influenza     Plan:  Age appropriate preventive healthcare needs addressed; encouraged regular eye doctor and dental exams; encouraged regular exercise; will update labs and refills as needed today; follow-up to be determined; Flu shot updated;  continue with GYN;  This visit occurred during the SARS-CoV-2 public health emergency.  Safety protocols were in place, including screening questions prior to the visit, additional usage of staff PPE, and extensive cleaning of exam room while observing appropriate contact time as indicated for disinfecting solutions.    No follow-ups on file.  Orders Placed This Encounter  Procedures   Flu Vaccine QUAD  60moIM (Fluarix, Fluzone & Alfiuria Quad PF)   CBC with Differential/Platelet   Comp Met (CMET)   Lipid panel   TSH   Hemoglobin A1c   B12    Requested Prescriptions    No prescriptions requested or ordered in this encounter

## 2021-08-11 ENCOUNTER — Encounter: Payer: Self-pay | Admitting: Family

## 2021-08-11 ENCOUNTER — Other Ambulatory Visit: Payer: Self-pay | Admitting: Family

## 2021-08-11 MED ORDER — METFORMIN HCL ER 500 MG PO TB24: 500.0000 mg | ORAL_TABLET | Freq: Every day | ORAL | 0 refills | Status: DC

## 2021-08-20 NOTE — Progress Notes (Signed)
°  [name] [address] [place] [address] Phone: [phone] Subjective:   [name], am serving as a scribe for Dr. Antoine Primas. This visit occurred during the SARS-CoV-2 public health emergency.  Safety protocols were in place, including screening questions prior to the visit, additional usage of staff PPE, and extensive cleaning of exam room while observing appropriate contact time as indicated for disinfecting solutions.   I'm seeing this patient by the request  of:  [name], FNP  CC: Low back pain follow-up  [id number]  Kathy Dixon is a 40 y.o. female coming in with complaint of back and neck pain. OMT on 03/25/2021. Patient states that she bent down to help her mother in law up from a fall and is having back and R hamstring pain. Does not feel like pain is as bad as when we first saw her.   Medications patient has been prescribed: None        Reviewed prior external information including notes and imaging from previsou exam, outside providers and external EMR if available.   As well as notes that were available from care everywhere and other healthcare systems.  Past medical history, social, surgical and family history all reviewed in electronic medical record.  No pertanent information unless stated regarding to the chief complaint.   Past Medical History:  Diagnosis Date   History of varicella    Hypertension    no meds    No Known Allergies   Review of Systems:  No headache, visual changes, nausea, vomiting, diarrhea, constipation, dizziness, abdominal pain, skin rash, fevers, chills, night sweats, weight loss, swollen lymph nodes, body aches, joint swelling, chest pain, shortness of breath, mood changes. POSITIVE muscle aches  Objective  Blood pressure 120/82, pulse 83, height 5\' 3"  (1.6 m), weight 202 lb (91.6 kg), last menstrual period 08/02/2021, unknown if currently breastfeeding.   General:  No apparent distress alert and oriented x3 mood and affect normal, dressed appropriately.  HEENT: Pupils equal, extraocular movements intact  Respiratory: Patient's speak in full sentences and does not appear short of breath  Cardiovascular: No lower extremity edema, non tender, no erythema  No back exam does have some loss of lordosis.  Patient does still have some tightness noted.  5-5 strength in lower extremities.  Osteopathic findings  T6 extended rotated and side bent left L1 flexed rotated and side bent right L5 flexed rotated and side bent left Sacrum right on right       Assessment and Plan:  Spondylolisthesis at L4-L5 level discussed avoiding certain activities.  Follow-up with me again in 6 to 8 weeks patient has done relatively well over the course of time.   Nonallopathic problems  Decision today to treat with OMT was based on Physical Exam  After verbal consent patient was treated with HVLA, ME, FPR techniques in thoracic, lumbar, and sacral  areas  Patient tolerated the procedure well with improvement in symptoms  Patient given exercises, stretches and lifestyle modifications  See medications in patient instructions if given  Patient will follow up in 4-8 weeks      The above documentation has been reviewed and is accurate and complete 14/06/2021, DO        Note: This dictation was prepared with Dragon dictation along with smaller phrase technology. Any transcriptional errors that result from this process are unintentional.

## 2021-08-25 ENCOUNTER — Ambulatory Visit: Payer: BC Managed Care – PPO | Admitting: Family Medicine

## 2021-08-25 ENCOUNTER — Other Ambulatory Visit: Payer: Self-pay

## 2021-08-25 VITALS — BP 120/82 | HR 83 | Ht 63.0 in | Wt 202.0 lb

## 2021-08-25 DIAGNOSIS — M9904 Segmental and somatic dysfunction of sacral region: Secondary | ICD-10-CM

## 2021-08-25 DIAGNOSIS — M9902 Segmental and somatic dysfunction of thoracic region: Secondary | ICD-10-CM

## 2021-08-25 DIAGNOSIS — M9903 Segmental and somatic dysfunction of lumbar region: Secondary | ICD-10-CM

## 2021-08-25 DIAGNOSIS — M4316 Spondylolisthesis, lumbar region: Secondary | ICD-10-CM

## 2021-08-25 NOTE — Patient Instructions (Signed)
Great to see you as always Try to do exercises See me in 2-3 months

## 2021-08-25 NOTE — Assessment & Plan Note (Signed)
discussed avoiding certain activities.  Follow-up with me again in 6 to 8 weeks patient has done relatively well over the course of time.

## 2021-10-14 LAB — HM PAP SMEAR: HM Pap smear: NORMAL

## 2021-10-16 ENCOUNTER — Other Ambulatory Visit: Payer: Self-pay | Admitting: Obstetrics and Gynecology

## 2021-10-16 DIAGNOSIS — R928 Other abnormal and inconclusive findings on diagnostic imaging of breast: Secondary | ICD-10-CM

## 2021-10-29 ENCOUNTER — Ambulatory Visit: Payer: BC Managed Care – PPO

## 2021-10-29 ENCOUNTER — Ambulatory Visit
Admission: RE | Admit: 2021-10-29 | Discharge: 2021-10-29 | Disposition: A | Discharge status: Home / Self Care | Payer: BC Managed Care – PPO | Source: Ambulatory Visit | Attending: Obstetrics and Gynecology | Admitting: Obstetrics and Gynecology

## 2021-10-29 ENCOUNTER — Other Ambulatory Visit: Payer: BC Managed Care – PPO

## 2021-10-29 DIAGNOSIS — R928 Other abnormal and inconclusive findings on diagnostic imaging of breast: Secondary | ICD-10-CM

## 2021-11-11 NOTE — Progress Notes (Signed)
?  Charlann Boxer D.O. ?[facility] ?[place] ?Phone: [phone] ?Subjective:   ?I, Kathy Dixon, am serving as a scribe for Dr. Hulan Saas. ? ?This visit occurred during the SARS-CoV-2 public health emergency.  Safety protocols were in place, including screening questions prior to the visit, additional usage of staff PPE, and extensive cleaning of exam room while observing appropriate contact time as indicated for disinfecting solutions.  ? ? ?I'm seeing this patient by the request  of:  Marrian Salvage, FNP ? ?CC: back and neck pain  ? ?[id number]  ?Kathy Dixon is a 41 y.o. female coming in with complaint of back and neck pain. OMT 08/25/2021. Patient states that she is doing well. ? ?Medications patient has been prescribed: None ? ?Taking: ? ? ?  ? ? ? ? ?Reviewed prior external information including notes and imaging from previsou exam, outside providers and external EMR if available.  ? ?As well as notes that were available from care everywhere and other healthcare systems. ? ?Past medical history, social, surgical and family history all reviewed in electronic medical record.  No pertanent information unless stated regarding to the chief complaint.  ? ?Past Medical History:  ?Diagnosis Date  ? History of varicella   ? Hypertension   ? no meds  ?  ?No Known Allergies ? ? ?Review of Systems: ? No headache, visual changes, nausea, vomiting, diarrhea, constipation, dizziness, abdominal pain, skin rash, fevers, chills, night sweats, weight loss, swollen lymph nodes, body aches, joint swelling, chest pain, shortness of breath, mood changes. POSITIVE muscle aches ? ?Objective  ?Blood pressure 122/82, pulse 76, height 5\' 3"  (1.6 m), weight 200 lb (90.7 kg). ?  ?General: No apparent distress alert and oriented x3 mood and affect normal, dressed appropriately.  ?HEENT: Pupils equal, extraocular movements intact  ?Respiratory: Patient's speak in full sentences and does  not appear short of breath  ?Cardiovascular: No lower extremity edema, non tender, no erythema  ?Low back exam does have some mild loss of lordosis mild tightness with Corky Sox patient now has good strength getting out of a chair. ? ?Osteopathic findings ? ?C2 flexed rotated and side bent left ?C4 flexed rotated and side bentright ?T2 extended rotated and side bent left inhaled rib ?L1 flexed rotated and side bent right ?Sacrum right on right ? ? ? ? ?  ?Assessment and Plan: ? ?Spondylolisthesis at L4-L5 level ?Chronic but stable discussed HEP patient is doing very well overall.  Has lost weight and also has strengthen core.  Patient responds extremely well to osteopathic manipulation we will follow-up again in 2 to 3 months ?  ? ?Nonallopathic problems ? ?Decision today to treat with OMT was based on Physical Exam ? ?After verbal consent patient was treated with HVLA, ME, FPR techniques in cervical, rib, thoracic, lumbar, and sacral  areas ? ?Patient tolerated the procedure well with improvement in symptoms ? ?Patient given exercises, stretches and lifestyle modifications ? ?See medications in patient instructions if given ? ?Patient will follow up in 4-8 weeks ? ?  ? ?The above documentation has been reviewed and is accurate and complete Lyndal Pulley, DO ? ? ? ?  ? ? Note: This dictation was prepared with Dragon dictation along with smaller phrase technology. Any transcriptional errors that result from this process are unintentional.    ?  ?  ? ?

## 2021-11-12 ENCOUNTER — Other Ambulatory Visit: Payer: Self-pay

## 2021-11-12 ENCOUNTER — Ambulatory Visit: Payer: BC Managed Care – PPO | Admitting: Family Medicine

## 2021-11-12 VITALS — BP 122/82 | HR 76 | Ht 63.0 in | Wt 200.0 lb

## 2021-11-12 DIAGNOSIS — M9908 Segmental and somatic dysfunction of rib cage: Secondary | ICD-10-CM

## 2021-11-12 DIAGNOSIS — M9902 Segmental and somatic dysfunction of thoracic region: Secondary | ICD-10-CM

## 2021-11-12 DIAGNOSIS — M9903 Segmental and somatic dysfunction of lumbar region: Secondary | ICD-10-CM | POA: Diagnosis not present

## 2021-11-12 DIAGNOSIS — M9904 Segmental and somatic dysfunction of sacral region: Secondary | ICD-10-CM

## 2021-11-12 DIAGNOSIS — M4316 Spondylolisthesis, lumbar region: Secondary | ICD-10-CM

## 2021-11-12 DIAGNOSIS — M9901 Segmental and somatic dysfunction of cervical region: Secondary | ICD-10-CM | POA: Diagnosis not present

## 2021-11-12 NOTE — Assessment & Plan Note (Signed)
Chronic but stable discussed HEP patient is doing very well overall.  Has lost weight and also has strengthen core.  Patient responds extremely well to osteopathic manipulation we will follow-up again in 2 to 3 months ?

## 2021-11-12 NOTE — Patient Instructions (Signed)
Great to see you ?Doing fantastic  ?See me again in 2-3 months ?

## 2021-11-13 ENCOUNTER — Ambulatory Visit: Payer: BC Managed Care – PPO | Admitting: Family Medicine

## 2022-01-27 NOTE — Progress Notes (Signed)
  Kathy Dixon [address] [place] [address] Phone: [phone] Subjective:   [name], am serving as a scribe for Dr. Antoine Primas.   I'm seeing this patient by the request  of:  [name], FNP  CC: low back pain follow up   [id number]  Kathy Dixon is a 41 y.o. female coming in with complaint of back and neck pain. OMT 11/12/2021. Patient states that overall she has been doing well. Had some days where her back has been worse as she pushes her husband in wheelchair.   Medications patient has been prescribed: None  Taking:         Reviewed prior external information including notes and imaging from previsou exam, outside providers and external EMR if available.   As well as notes that were available from care everywhere and other healthcare systems.  Past medical history, social, surgical and family history all reviewed in electronic medical record.  No pertanent information unless stated regarding to the chief complaint.   Past Medical History:  Diagnosis Date   History of varicella    Hypertension    no meds    No Known Allergies   Review of Systems:  No headache, visual changes, nausea, vomiting, diarrhea, constipation, dizziness, abdominal pain, skin rash, fevers, chills, night sweats, weight loss, swollen lymph nodes, body aches, joint swelling, chest pain, shortness of breath, mood changes. POSITIVE muscle aches  Objective  Blood pressure 112/82, pulse 73, height 5\' 3"  (1.6 m), weight 192 lb (87.1 kg), SpO2 98 %.   General: No apparent distress alert and oriented x3 mood and affect normal, dressed appropriately.  HEENT: Pupils equal, extraocular movements intact  Respiratory: Patient's speak in full sentences and does not appear short of breath  Cardiovascular: No lower extremity edema, non tender, no erythema  Low back exam does have loss of lordosis  Positive FABER Neg SLT  Tightness right  scapular region  Neg Spurling   Osteopathic findings  C2 flexed rotated and side bent right C5 flexed rotated and side bent left T3 extended rotated and side bent right inhaled rib T9 extended rotated and side bent left L2 flexed rotated and side bent right Sacrum right on right       Assessment and Plan:  Spondylolisthesis at L4-L5 level Chronic pulm with mild exacerbation.  Patient has been doing more work around the house recently.  Discussed icing regimen and home exercises.  Discussed avoiding certain activities.  Follow-up with me again in 6 to 8 weeks   Nonallopathic problems  Decision today to treat with OMT was based on Physical Exam  After verbal consent patient was treated with HVLA, ME, FPR techniques in cervical, rib, thoracic, lumbar, and sacral  areas  Patient tolerated the procedure well with improvement in symptoms  Patient given exercises, stretches and lifestyle modifications  See medications in patient instructions if given  Patient will follow up in 4-8 weeks     The above documentation has been reviewed and is accurate and complete , DO        Note: This dictation was prepared with Dragon dictation along with smaller phrase technology. Any transcriptional errors that result from this process are unintentional.

## 2022-01-28 ENCOUNTER — Ambulatory Visit (INDEPENDENT_AMBULATORY_CARE_PROVIDER_SITE_OTHER): Payer: BC Managed Care – PPO | Admitting: Family Medicine

## 2022-01-28 ENCOUNTER — Encounter: Payer: Self-pay | Admitting: Family Medicine

## 2022-01-28 VITALS — BP 112/82 | HR 73 | Ht 63.0 in | Wt 192.0 lb

## 2022-01-28 DIAGNOSIS — M9901 Segmental and somatic dysfunction of cervical region: Secondary | ICD-10-CM | POA: Diagnosis not present

## 2022-01-28 DIAGNOSIS — M9902 Segmental and somatic dysfunction of thoracic region: Secondary | ICD-10-CM

## 2022-01-28 DIAGNOSIS — M9908 Segmental and somatic dysfunction of rib cage: Secondary | ICD-10-CM

## 2022-01-28 DIAGNOSIS — M9903 Segmental and somatic dysfunction of lumbar region: Secondary | ICD-10-CM | POA: Diagnosis not present

## 2022-01-28 DIAGNOSIS — M4316 Spondylolisthesis, lumbar region: Secondary | ICD-10-CM | POA: Diagnosis not present

## 2022-01-28 DIAGNOSIS — M9904 Segmental and somatic dysfunction of sacral region: Secondary | ICD-10-CM

## 2022-01-28 NOTE — Patient Instructions (Signed)
Good to see you Find time for yourself See me in 6 weeks

## 2022-01-28 NOTE — Assessment & Plan Note (Signed)
Chronic pulm with mild exacerbation.  Patient has been doing more work around the house recently.  Discussed icing regimen and home exercises.  Discussed avoiding certain activities.  Follow-up with me again in 6 to 8 weeks

## 2022-03-11 ENCOUNTER — Ambulatory Visit: Payer: BC Managed Care – PPO | Admitting: Family Medicine

## 2022-03-25 NOTE — Progress Notes (Signed)
  [name] [address] [place] [address] Phone: [phone] Subjective:   [name], am serving as a scribe for Dr. Antoine Primas.  I'm seeing this patient by the request  of:  [name], FNP  CC: Chronic problem with back and neck pain  [id number]  Kathy Dixon is a 41 y.o. female coming in with complaint of back and neck pain. OMT on 01/28/2022. Patient states that she has been doing better. Still having pain but moving husband less as he is more mobile.   Medications patient has been prescribed: None  Taking:         Reviewed prior external information including notes and imaging from previsou exam, outside providers and external EMR if available.   As well as notes that were available from care everywhere and other healthcare systems.  Past medical history, social, surgical and family history all reviewed in electronic medical record.  No pertanent information unless stated regarding to the chief complaint.   Past Medical History:  Diagnosis Date   History of varicella    Hypertension    no meds    No Known Allergies   Review of Systems:  No headache, visual changes, nausea, vomiting, diarrhea, constipation, dizziness, abdominal pain, skin rash, fevers, chills, night sweats, weight loss, swollen lymph nodes, body aches, joint swelling, chest pain, shortness of breath, mood changes. POSITIVE muscle aches  Objective  Blood pressure 108/76, pulse 86, height 5\' 3"  (1.6 m), SpO2 97 %.   General: No apparent distress alert and oriented x3 mood and affect normal, dressed appropriately.  HEENT: Pupils equal, extraocular movements intact  Respiratory: Patient's speak in full sentences and does not appear short of breath  Cardiovascular: No lower extremity edema, non tender, no erythema  Gait MSK:  Back does have some loss of lordosis.  Some tenderness to palpation in the paraspinal musculature.  Worsening  pain over the right sacroiliac joint.  Patient does have a positive .  Less than 10 degrees of extension of the back  Osteopathic findings  C6 flexed rotated and side bent left T5 extended rotated and side bent left inhaled rib L3 flexed rotated and side bent left L4 flexed rotated and side bent right Sacrum right on right       Assessment and Plan:  Spondylolisthesis at L4-L5 level Chronic problem with mild exacerbation again.  Discussed posture and ergonomics, discussed core strengthening that I think will be helpful as well.  Discussed which activities to do and which ones to avoid.  Increase activity slowly.  Follow-up again in 6 to 8 weeks.    Nonallopathic problems  Decision today to treat with OMT was based on Physical Exam  After verbal consent patient was treated with HVLA, ME, FPR techniques in cervical, rib, thoracic, lumbar, and sacral  areas  Patient tolerated the procedure well with improvement in symptoms  Patient given exercises, stretches and lifestyle modifications  See medications in patient instructions if given  Patient will follow up in 4-8 weeks    The above documentation has been reviewed and is accurate and complete [name], DO          Note: This dictation was prepared with Dragon dictation along with smaller phrase technology. Any transcriptional errors that result from this process are unintentional.

## 2022-03-30 ENCOUNTER — Ambulatory Visit (INDEPENDENT_AMBULATORY_CARE_PROVIDER_SITE_OTHER): Payer: BC Managed Care – PPO | Admitting: Family Medicine

## 2022-03-30 VITALS — BP 108/76 | HR 86 | Ht 63.0 in

## 2022-03-30 DIAGNOSIS — M9903 Segmental and somatic dysfunction of lumbar region: Secondary | ICD-10-CM | POA: Diagnosis not present

## 2022-03-30 DIAGNOSIS — M9901 Segmental and somatic dysfunction of cervical region: Secondary | ICD-10-CM

## 2022-03-30 DIAGNOSIS — M4316 Spondylolisthesis, lumbar region: Secondary | ICD-10-CM

## 2022-03-30 DIAGNOSIS — M9908 Segmental and somatic dysfunction of rib cage: Secondary | ICD-10-CM | POA: Diagnosis not present

## 2022-03-30 DIAGNOSIS — M9904 Segmental and somatic dysfunction of sacral region: Secondary | ICD-10-CM | POA: Diagnosis not present

## 2022-03-30 DIAGNOSIS — M9902 Segmental and somatic dysfunction of thoracic region: Secondary | ICD-10-CM

## 2022-03-30 NOTE — Assessment & Plan Note (Signed)
Chronic problem with mild exacerbation again.  Discussed posture and ergonomics, discussed core strengthening that I think will be helpful as well.  Discussed which activities to do and which ones to avoid.  Increase activity slowly.  Follow-up again in 6 to 8 weeks.

## 2022-03-30 NOTE — Patient Instructions (Addendum)
Good to see you Do not overextend with bridging See me in 6-8 weeks

## 2022-05-17 NOTE — Progress Notes (Unsigned)
  Kathy Dixon Phone: [phone] Subjective:   Kathy Dixon, am serving as a scribe for Dr. Hulan Saas.  I'm seeing this patient by the request  of:  Marrian Salvage, FNP  CC: low back pain    [id number]  Kathy Dixon is a 41 y.o. female coming in with complaint of back and neck pain. OMT on 03/30/2022. Patient states that she has not been feeling that bad since last visit.  Patient states some stiffness.  Not doing any working out and is looking for different ways to get motivated to work out.  Medications patient has been prescribed: None  Taking:         Reviewed prior external information including notes and imaging from previsou exam, outside providers and external EMR if available.   As well as notes that were available from care everywhere and other healthcare systems.  Past medical history, social, surgical and family history all reviewed in electronic medical record.  Dixon pertanent information unless stated regarding to the chief complaint.   Past Medical History:  Diagnosis Date   History of varicella    Hypertension    Dixon meds       Review of Systems:  Dixon headache, visual changes, nausea, vomiting, diarrhea, constipation, dizziness, abdominal pain, skin rash, fevers, chills, night sweats, weight loss, swollen lymph nodes, body aches, joint swelling, chest pain, shortness of breath, mood changes. POSITIVE muscle aches  Objective  Blood pressure 118/82, pulse 86, height 5\' 3"  (1.6 m), weight 206 lb (93.4 kg).   General: Dixon apparent distress alert and oriented x3 mood and affect normal, dressed appropriately.  HEENT: Pupils equal, extraocular movements intact  Respiratory: Patient's speak in full sentences and does not appear short of breath  Cardiovascular: Dixon lower extremity edema, non tender, Dixon erythema  MSK:  Back low back still has tenderness to palpation over the  sacroiliac joint.  Patient also has tenderness to palpation over the low back with tightness noted in the lumbar spine  Osteopathic findings  C6 flexed rotated and side bent left T8 extended rotated and side bent left L2 flexed rotated and side bent right Sacrum right on right    Assessment and Plan:  Spondylolisthesis at L4-L5 level Low back does have loss of lordosis continue to work on core strength.  It is discussed with patient about different ways to be more active.  We discussed home exercises, icing regimen, which activities to which ones to avoid.  Follow-up again in 6 to 8 weeks.    Nonallopathic problems  Decision today to treat with OMT was based on Physical Exam  After verbal consent patient was treated with HVLA, ME, FPR techniques in cervical,  thoracic, lumbar, and sacral  areas  Patient tolerated the procedure well with improvement in symptoms  Patient given exercises, stretches and lifestyle modifications  See medications in patient instructions if given  Patient will follow up in 4-8 weeks    The above documentation has been reviewed and is accurate and complete Kathy Pulley, DO          Note: This dictation was prepared with Dragon dictation along with smaller phrase technology. Any transcriptional errors that result from this process are unintentional.

## 2022-05-18 ENCOUNTER — Ambulatory Visit (INDEPENDENT_AMBULATORY_CARE_PROVIDER_SITE_OTHER): Payer: BC Managed Care – PPO | Admitting: Family Medicine

## 2022-05-18 VITALS — BP 118/82 | HR 86 | Ht 63.0 in | Wt 206.0 lb

## 2022-05-18 DIAGNOSIS — M9904 Segmental and somatic dysfunction of sacral region: Secondary | ICD-10-CM

## 2022-05-18 DIAGNOSIS — M9903 Segmental and somatic dysfunction of lumbar region: Secondary | ICD-10-CM

## 2022-05-18 DIAGNOSIS — M9902 Segmental and somatic dysfunction of thoracic region: Secondary | ICD-10-CM | POA: Diagnosis not present

## 2022-05-18 DIAGNOSIS — M4316 Spondylolisthesis, lumbar region: Secondary | ICD-10-CM | POA: Diagnosis not present

## 2022-05-18 DIAGNOSIS — M9901 Segmental and somatic dysfunction of cervical region: Secondary | ICD-10-CM | POA: Diagnosis not present

## 2022-05-18 NOTE — Patient Instructions (Signed)
Good to see you Check out [employer] on demand Glad I am making you a morning person See me in 2 months

## 2022-05-18 NOTE — Assessment & Plan Note (Addendum)
Low back does have loss of lordosis continue to work on core strength.  It is discussed with patient about different ways to be more active.  We discussed home exercises, icing regimen, which activities to which ones to avoid.  Follow-up again in 6 to 8 weeks.

## 2022-07-14 NOTE — Progress Notes (Unsigned)
[name] [address] [place] [address] Phone: [phone] Subjective:   [name], am serving as a scribe for Dr. Antoine Primas.  I'm seeing this patient by the request  of:  [name], FNP  CC: Back and neck pain follow-up  [id number]  Kathy Dixon is a 41 y.o. female coming in with complaint of back and neck pain. OMT 05/18/2022. Patient states that she has been doing good since last visit. Pain is no worse or no better.   Medications patient has been prescribed: None  Taking:         Reviewed prior external information including notes and imaging from previsou exam, outside providers and external EMR if available.   As well as notes that were available from care everywhere and other healthcare systems.  Past medical history, social, surgical and family history all reviewed in electronic medical record.  No pertanent information unless stated regarding to the chief complaint.   Past Medical History:  Diagnosis Date   History of varicella    Hypertension    no meds    No Known Allergies   Review of Systems:  No headache, visual changes, nausea, vomiting, diarrhea, constipation, dizziness, abdominal pain, skin rash, fevers, chills, night sweats, weight loss, swollen lymph nodes, body aches, joint swelling, chest pain, shortness of breath, mood changes. POSITIVE muscle aches  Objective  Blood pressure 122/88, pulse 74, height 5\' 3"  (1.6 m), weight 208 lb (94.3 kg), SpO2 99 %.   General: No apparent distress alert and oriented x3 mood and affect normal, dressed appropriately.  HEENT: Pupils equal, extraocular movements intact  Respiratory: Patient's speak in full sentences and does not appear short of breath  Cardiovascular: No lower extremity edema, non tender, no erythema  Gait MSK:  Back does have some loss of lordosis.  Patient does have tightness noted of the neck also today though.  Patient does  have some tenderness to palpation.  Neck exam does have some limited sidebending bilaterally.  Osteopathic findings  C3 flexed rotated and side bent right C6 flexed rotated and side bent left T3 extended rotated and side bent right inhaled rib T8 extended rotated and side bent left L2 flexed rotated and side bent right Sacrum right on right     Assessment and Plan:  Spondylolisthesis at L4-L5 level Known spondylolisthesis of the lower back.  Did respond extremely well to osteopathic manipulation.  Patient has some tightness also noted of the cervical neck today.  Patient recently has had a cold and I think that this may be contributing to some of this.  We discussed with patient at great length about icing regimen, home exercises, which activities to do and which ones to avoid.  Increase activity slowly otherwise.  Follow-up again in 6 to 8 weeks  Neck tightness Seems postural overall but did respond extremely well to osteopathic manipulation.  Discussed which activities to do and which ones to avoid, increase activity slowly.  Follow-up again in 6 to 8 weeks    Nonallopathic problems  Decision today to treat with OMT was based on Physical Exam  After verbal consent patient was treated with HVLA, ME, FPR techniques in cervical, rib, thoracic, lumbar, and sacral  areas  Patient tolerated the procedure well with improvement in symptoms  Patient given exercises, stretches and lifestyle modifications  See medications in patient instructions if given  Patient will follow up in 4-8 weeks     The above  documentation has been reviewed and is accurate and complete Judi Saa, DO         Note: This dictation was prepared with Dragon dictation along with smaller phrase technology. Any transcriptional errors that result from this process are unintentional.

## 2022-07-20 ENCOUNTER — Ambulatory Visit (INDEPENDENT_AMBULATORY_CARE_PROVIDER_SITE_OTHER): Payer: BC Managed Care – PPO | Admitting: Family Medicine

## 2022-07-20 ENCOUNTER — Encounter: Payer: Self-pay | Admitting: Family Medicine

## 2022-07-20 VITALS — BP 122/88 | HR 74 | Ht 63.0 in | Wt 208.0 lb

## 2022-07-20 DIAGNOSIS — M9902 Segmental and somatic dysfunction of thoracic region: Secondary | ICD-10-CM

## 2022-07-20 DIAGNOSIS — M9908 Segmental and somatic dysfunction of rib cage: Secondary | ICD-10-CM | POA: Diagnosis not present

## 2022-07-20 DIAGNOSIS — M4316 Spondylolisthesis, lumbar region: Secondary | ICD-10-CM

## 2022-07-20 DIAGNOSIS — M9903 Segmental and somatic dysfunction of lumbar region: Secondary | ICD-10-CM

## 2022-07-20 DIAGNOSIS — M9901 Segmental and somatic dysfunction of cervical region: Secondary | ICD-10-CM | POA: Diagnosis not present

## 2022-07-20 DIAGNOSIS — R29898 Other symptoms and signs involving the musculoskeletal system: Secondary | ICD-10-CM

## 2022-07-20 DIAGNOSIS — M9904 Segmental and somatic dysfunction of sacral region: Secondary | ICD-10-CM

## 2022-07-20 NOTE — Patient Instructions (Signed)
Good to see you Keep thinking about that gift for yourself See me in 7-8 weeks

## 2022-07-20 NOTE — Assessment & Plan Note (Signed)
Known spondylolisthesis of the lower back.  Did respond extremely well to osteopathic manipulation.  Patient has some tightness also noted of the cervical neck today.  Patient recently has had a cold and I think that this may be contributing to some of this.  We discussed with patient at great length about icing regimen, home exercises, which activities to do and which ones to avoid.  Increase activity slowly otherwise.  Follow-up again in 6 to 8 weeks

## 2022-07-20 NOTE — Assessment & Plan Note (Signed)
Seems postural overall but did respond extremely well to osteopathic manipulation.  Discussed which activities to do and which ones to avoid, increase activity slowly.  Follow-up again in 6 to 8 weeks

## 2022-09-01 NOTE — Progress Notes (Deleted)
  [place] Phone: [phone] Subjective:    I'm seeing this patient by the request  of:  Marrian Salvage, FNP  CC:   [id number]  Kathy Dixon is a 42 y.o. female coming in with complaint of back and neck pain. OMT 07/20/2022. Patient states   Medications patient has been prescribed: None  Taking:         Reviewed prior external information including notes and imaging from previsou exam, outside providers and external EMR if available.   As well as notes that were available from care everywhere and other healthcare systems.  Past medical history, social, surgical and family history all reviewed in electronic medical record.  No pertanent information unless stated regarding to the chief complaint.   Past Medical History:  Diagnosis Date   History of varicella    Hypertension    no meds    No Known Allergies   Review of Systems:  No headache, visual changes, nausea, vomiting, diarrhea, constipation, dizziness, abdominal pain, skin rash, fevers, chills, night sweats, weight loss, swollen lymph nodes, body aches, joint swelling, chest pain, shortness of breath, mood changes. POSITIVE muscle aches  Objective  There were no vitals taken for this visit.   General: No apparent distress alert and oriented x3 mood and affect normal, dressed appropriately.  HEENT: Pupils equal, extraocular movements intact  Respiratory: Patient's speak in full sentences and does not appear short of breath  Cardiovascular: No lower extremity edema, non tender, no erythema  Gait MSK:  Back   Osteopathic findings  C2 flexed rotated and side bent right C6 flexed rotated and side bent left T3 extended rotated and side bent right inhaled rib T9 extended rotated and side bent left L2 flexed rotated and side bent right Sacrum right on right       Assessment and Plan:  No problem-specific Assessment &  Plan notes found for this encounter.    Nonallopathic problems  Decision today to treat with OMT was based on Physical Exam  After verbal consent patient was treated with HVLA, ME, FPR techniques in cervical, rib, thoracic, lumbar, and sacral  areas  Patient tolerated the procedure well with improvement in symptoms  Patient given exercises, stretches and lifestyle modifications  See medications in patient instructions if given  Patient will follow up in 4-8 weeks             Note: This dictation was prepared with Dragon dictation along with smaller phrase technology. Any transcriptional errors that result from this process are unintentional.

## 2022-09-07 ENCOUNTER — Ambulatory Visit: Payer: BC Managed Care – PPO | Admitting: Family Medicine

## 2022-09-28 ENCOUNTER — Ambulatory Visit: Payer: 59 | Admitting: Family Medicine

## 2022-09-28 VITALS — BP 112/74 | HR 90 | Ht 63.0 in | Wt 206.0 lb

## 2022-09-28 DIAGNOSIS — M9903 Segmental and somatic dysfunction of lumbar region: Secondary | ICD-10-CM | POA: Diagnosis not present

## 2022-09-28 DIAGNOSIS — M9904 Segmental and somatic dysfunction of sacral region: Secondary | ICD-10-CM | POA: Diagnosis not present

## 2022-09-28 DIAGNOSIS — M4316 Spondylolisthesis, lumbar region: Secondary | ICD-10-CM

## 2022-09-28 DIAGNOSIS — M9902 Segmental and somatic dysfunction of thoracic region: Secondary | ICD-10-CM | POA: Diagnosis not present

## 2022-09-28 NOTE — Assessment & Plan Note (Signed)
Known spinal and previously seen and still concerned with some intermittent radicular symptoms.  Discussed with patient at great length about icing regimen and home exercises, which activities to do and which ones to avoid.  Increase activity slowly otherwise.  Follow-up again 6 to 8 weeks.  Did discuss anti-inflammatories.

## 2022-09-28 NOTE — Progress Notes (Signed)
  Kathy Dixon [address] [place] Phone: [phone] Subjective:   IVilma Dixon, am serving as a scribe for Dr. Hulan Saas.  I'm seeing this patient by the request  of:  Marrian Salvage, FNP  CC: Back and neck pain follow-up  [id number]  Kathy Dixon is a 42 y.o. female coming in with complaint of back and neck pain. OMT on 07/20/2022. Patient states doing well.  Patient has some tightness.  States that sometimes has some intermittent pain that goes down the leg.  Medications patient has been prescribed:   Taking:         Reviewed prior external information including notes and imaging from previsou exam, outside providers and external EMR if available.   As well as notes that were available from care everywhere and other healthcare systems.  Past medical history, social, surgical and family history all reviewed in electronic medical record.  No pertanent information unless stated regarding to the chief complaint.   Past Medical History:  Diagnosis Date   History of varicella    Hypertension    no meds    No Known Allergies   Review of Systems:  No headache, visual changes, nausea, vomiting, diarrhea, constipation, dizziness, abdominal pain, skin rash, fevers, chills, night sweats, weight loss, swollen lymph nodes, body aches, joint swelling, chest pain, shortness of breath, mood changes. POSITIVE muscle aches  Objective  Blood pressure 112/74, pulse 90, height 5\' 3"  (1.6 m), weight 206 lb (93.4 kg), SpO2 97 %.   General: No apparent distress alert and oriented x3 mood and affect normal, dressed appropriately.  HEENT: Pupils equal, extraocular movements intact  Respiratory: Patient's speak in full sentences and does not appear short of breath  Cardiovascular: No lower extremity edema, non tender, no erythema  Low back does have some loss of lordosis still noted.  Tightness noted with certain tenderness  to palpation.  Tightness with FABER disease  Osteopathic findings   T8 extended rotated and side bent left L2 flexed rotated and side bent right Sacrum right on right       Assessment and Plan:  Spondylolisthesis at L4-L5 level Known spinal and previously seen and still concerned with some intermittent radicular symptoms.  Discussed with patient at great length about icing regimen and home exercises, which activities to do and which ones to avoid.  Increase activity slowly otherwise.  Follow-up again 6 to 8 weeks.  Did discuss anti-inflammatories.    Nonallopathic problems  Decision today to treat with OMT was based on Physical Exam  After verbal consent patient was treated with HVLA, ME, FPR techniques in  thoracic, lumbar, and sacral  areas  Patient tolerated the procedure well with improvement in symptoms  Patient given exercises, stretches and lifestyle modifications  See medications in patient instructions if given  Patient will follow up in 4-8 weeks    The above documentation has been reviewed and is accurate and complete Lyndal Pulley, DO          Note: This dictation was prepared with Dragon dictation along with smaller phrase technology. Any transcriptional errors that result from this process are unintentional.

## 2022-09-28 NOTE — Patient Instructions (Signed)
Good to see you! Keep working on bridges See you again in 8 weeks

## 2022-11-29 NOTE — Progress Notes (Signed)
[name] [address] [place] [address] Phone: [phone] Subjective:   Kathy Dixon, am serving as a scribe for Dr. Antoine Primas.  I'm seeing this patient by the request  of:  [name], FNP  CC: Low back pain follow-up  [id number]  Kathy Dixon is a 42 y.o. female coming in with complaint of back and neck pain. OMT on 09/28/2022. Patient states that she had more pain since last visit due to doing some activities around the house. Used a back brace yesterday which helped decrease her pain. Pain mostly occurring in R lumbar spine. Pain will radiating into the glute and SI joint.        Reviewed prior external information including notes and imaging from previsou exam, outside providers and external EMR if available.   As well as notes that were available from care everywhere and other healthcare systems.  Past medical history, social, surgical and family history all reviewed in electronic medical record.  No pertanent information unless stated regarding to the chief complaint.   Past Medical History:  Diagnosis Date   History of varicella    Hypertension    no meds    No Known Allergies   Review of Systems:  No headache, visual changes, nausea, vomiting, diarrhea, constipation, dizziness, abdominal pain, skin rash, fevers, chills, night sweats, weight loss, swollen lymph nodes, body aches, joint swelling, chest pain, shortness of breath, mood changes. POSITIVE muscle aches  Objective  Blood pressure 112/78, pulse (!) 108, height 5\' 3"  (1.6 m), weight 208 lb 9.6 oz (94.6 kg), SpO2 99 %.   General: No apparent distress alert and oriented x3 mood and affect normal, dressed appropriately.  HEENT: Pupils equal, extraocular movements intact  Respiratory: Patient's speak in full sentences and does not appear short of breath  Cardiovascular: No lower extremity edema, non tender, no erythema  Low back exam does have  some loss of lordosis noted.  Does have some worsening pain with extension of the back noted.  Osteopathic findings  C2 flexed rotated and side bent right C6 flexed rotated and side bent lef T9 extended rotated and side bent left L3 flexed rotated and side bent right L4 flexed rotated and side bent left Sacrum right on right       Assessment and Plan:  Spondylolisthesis at L4-L5 level Chronic problem with an exacerbation noted.  Discussed potential injections, patient is going to continue with conservative therapy.  Discussed icing regimen and home exercises.  Discussed which activities to do and which ones to avoid.  Increase activity slowly.  Patient did do a lot of repetitive motions that likely contributed to some of the aches and pains and injury.  Follow-up with me again in 6 to 8 weeks    Nonallopathic problems  Decision today to treat with OMT was based on Physical Exam  After verbal consent patient was treated with HVLA, ME, FPR techniques in cervical, thoracic, lumbar, and sacral  areas  Patient tolerated the procedure well with improvement in symptoms  Patient given exercises, stretches and lifestyle modifications  See medications in patient instructions if given  Patient will follow up in 4-8 weeks     The above documentation has been reviewed and is accurate and complete Judi Saa, DO         Note: This dictation was prepared with Dragon dictation along with smaller phrase technology. Any transcriptional errors that result from this process are unintentional.

## 2022-11-30 ENCOUNTER — Ambulatory Visit: Payer: 59 | Admitting: Family Medicine

## 2022-11-30 ENCOUNTER — Encounter: Payer: Self-pay | Admitting: Family Medicine

## 2022-11-30 VITALS — BP 112/78 | HR 108 | Ht 63.0 in | Wt 208.6 lb

## 2022-11-30 DIAGNOSIS — M9902 Segmental and somatic dysfunction of thoracic region: Secondary | ICD-10-CM

## 2022-11-30 DIAGNOSIS — M9901 Segmental and somatic dysfunction of cervical region: Secondary | ICD-10-CM

## 2022-11-30 DIAGNOSIS — M4316 Spondylolisthesis, lumbar region: Secondary | ICD-10-CM | POA: Diagnosis not present

## 2022-11-30 DIAGNOSIS — M9903 Segmental and somatic dysfunction of lumbar region: Secondary | ICD-10-CM

## 2022-11-30 DIAGNOSIS — M9908 Segmental and somatic dysfunction of rib cage: Secondary | ICD-10-CM | POA: Diagnosis not present

## 2022-11-30 DIAGNOSIS — M9904 Segmental and somatic dysfunction of sacral region: Secondary | ICD-10-CM | POA: Diagnosis not present

## 2022-11-30 NOTE — Patient Instructions (Signed)
Good to see you Keep chucking all your husband's stuff See me in 7-8 weeks

## 2022-11-30 NOTE — Assessment & Plan Note (Signed)
Chronic problem with an exacerbation noted.  Discussed potential injections, patient is going to continue with conservative therapy.  Discussed icing regimen and home exercises.  Discussed which activities to do and which ones to avoid.  Increase activity slowly.  Patient did do a lot of repetitive motions that likely contributed to some of the aches and pains and injury.  Follow-up with me again in 6 to 8 weeks

## 2022-12-07 ENCOUNTER — Ambulatory Visit (INDEPENDENT_AMBULATORY_CARE_PROVIDER_SITE_OTHER): Payer: 59 | Admitting: Family

## 2022-12-07 ENCOUNTER — Encounter: Payer: Self-pay | Admitting: Family

## 2022-12-07 VITALS — BP 128/76 | HR 88 | Temp 98.8°F | Resp 19 | Ht 63.0 in | Wt 208.4 lb

## 2022-12-07 DIAGNOSIS — R7309 Other abnormal glucose: Secondary | ICD-10-CM

## 2022-12-07 DIAGNOSIS — Z13220 Encounter for screening for lipoid disorders: Secondary | ICD-10-CM | POA: Diagnosis not present

## 2022-12-07 DIAGNOSIS — Z0000 Encounter for general adult medical examination without abnormal findings: Secondary | ICD-10-CM

## 2022-12-07 LAB — LIPID PANEL
Cholesterol: 143 mg/dL (ref 0–200)
HDL: 51 mg/dL (ref >39.00)
LDL Cholesterol: 80 mg/dL (ref 0–99)
NonHDL: 91.93
Total CHOL/HDL Ratio: 3
Triglycerides: 58 mg/dL (ref 0.0–149.0)
VLDL: 11.6 mg/dL (ref 0.0–40.0)

## 2022-12-07 LAB — COMPREHENSIVE METABOLIC PANEL
ALT: 17 U/L (ref 0–35)
AST: 15 U/L (ref 0–37)
Albumin: 4.4 g/dL (ref 3.5–5.2)
Alkaline Phosphatase: 46 U/L (ref 39–117)
BUN: 11 mg/dL (ref 6–23)
CO2: 29 mEq/L (ref 19–32)
Calcium: 9.3 mg/dL (ref 8.4–10.5)
Chloride: 101 mEq/L (ref 96–112)
Creatinine, Ser: 0.75 mg/dL (ref 0.40–1.20)
GFR: 97.78 mL/min (ref >60.00)
Glucose, Bld: 84 mg/dL (ref 70–99)
Potassium: 4.4 mEq/L (ref 3.5–5.1)
Sodium: 137 mEq/L (ref 135–145)
Total Bilirubin: 0.6 mg/dL (ref 0.2–1.2)
Total Protein: 7.5 g/dL (ref 6.0–8.3)

## 2022-12-07 LAB — TSH: TSH: 2.27 u[IU]/mL (ref 0.35–5.50)

## 2022-12-07 LAB — CBC WITH DIFFERENTIAL/PLATELET
Basophils Absolute: 0 10*3/uL (ref 0.0–0.1)
Basophils Relative: 0.5 % (ref 0.0–3.0)
Eosinophils Absolute: 0.1 10*3/uL (ref 0.0–0.7)
Eosinophils Relative: 1.4 % (ref 0.0–5.0)
HCT: 40.7 % (ref 36.0–46.0)
Hemoglobin: 13.3 g/dL (ref 12.0–15.0)
Lymphocytes Relative: 31.4 % (ref 12.0–46.0)
Lymphs Abs: 2.4 10*3/uL (ref 0.7–4.0)
MCHC: 32.8 g/dL (ref 30.0–36.0)
MCV: 91.4 fl (ref 78.0–100.0)
Monocytes Absolute: 0.6 10*3/uL (ref 0.1–1.0)
Monocytes Relative: 7.2 % (ref 3.0–12.0)
Neutro Abs: 4.6 10*3/uL (ref 1.4–7.7)
Neutrophils Relative %: 59.5 % (ref 43.0–77.0)
Platelets: 309 10*3/uL (ref 150.0–400.0)
RBC: 4.45 Mil/uL (ref 3.87–5.11)
RDW: 13.9 % (ref 11.5–15.5)
WBC: 7.7 10*3/uL (ref 4.0–10.5)

## 2022-12-07 LAB — HEMOGLOBIN A1C: Hgb A1c MFr Bld: 6 % (ref 4.6–6.5)

## 2022-12-07 NOTE — Progress Notes (Signed)
Kathy Dixon is a 42 y.o. 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     Multiple Vitamin (MULTIVITAMIN) tablet Take 1 tablet by mouth daily.     No current facility-administered medications for this visit.    Allergies: Patient has no known allergies.  Past Medical History:  Diagnosis Date   History of varicella    Hypertension    no meds    Past Surgical History:  Procedure Laterality Date   CESAREAN SECTION N/A 09/20/2016   Procedure: CESAREAN SECTION;  Surgeon: Harold Hedge, MD;  Location: [facility];  Service: Obstetrics;  Laterality: N/A;    Family History  Problem Relation Age of Onset   Hypertension Mother    Hypertension Father    Other Father        myelofibrosis   Hypertension Sister    Diabetes Maternal Aunt    Stroke Maternal Aunt    Stroke Maternal Uncle     Social History   Tobacco Use   Smoking status: Never   Smokeless tobacco: Never  Substance Use Topics   Alcohol use: No    Comment: occ    Subjective:   Presents for yearly CPE; does see GYN regularly; has been working with sports medicine for back issues; had mammogram last week with GYN;    Huband had to have R below knee amputation in May 2022- notes that feels things are "getting back to normal." Has 88 yo daughter in kindergarten- done well;    LMP- IUD/ will be getting replaced this Thursday   Review of Systems  Constitutional: Negative.   HENT: Negative.    Eyes: Negative.   Respiratory: Negative.    Cardiovascular: Negative.   Gastrointestinal: Negative.    Genitourinary: Negative.   Musculoskeletal: Negative.   Skin: Negative.   Neurological: Negative.   Endo/Heme/Allergies: Negative.   Psychiatric/Behavioral: Negative.       Objective:  Vitals:   12/07/22 0826  BP: 128/76  Pulse: 88  Resp: 19  Temp: 98.8 F (37.1 C)  TempSrc: Oral  SpO2: 99%  Weight: 208 lb 6.4 oz (94.5 kg)  Height:  (1.6 m)    General: Well developed, well nourished, in no acute distress  Skin : Warm and dry.  Head: Normocephalic and atraumatic  Eyes: Sclera and conjunctiva clear; pupils round and reactive to light; extraocular movements intact  Ears: External normal; canals clear; tympanic membranes normal  Oropharynx: Pink, supple. No suspicious lesions  Neck: Supple without thyromegaly, adenopathy  Lungs: Respirations unlabored; clear to auscultation bilaterally without wheeze, rales, rhonchi  CVS exam: normal rate and regular rhythm.  Abdomen: Soft; nontender; nondistended; normoactive bowel sounds; no masses or hepatosplenomegaly  Musculoskeletal: No deformities; no active joint inflammation  Extremities: No edema, cyanosis, clubbing  Vessels: Symmetric bilaterally  Neurologic: Alert and oriented; speech intact; face symmetrical; moves all extremities well; CNII-XII intact without focal deficit   Assessment:  1. PE (physical exam), annual   2. Lipid screening   3. Elevated glucose     Plan:  Age appropriate preventive healthcare needs addressed; encouraged regular eye doctor and  dental exams; encouraged regular exercise; will update labs and refills as needed today; follow-up to be determined;   No follow-ups on file.  Orders Placed This Encounter  Procedures   CBC with Differential/Platelet   Comp Met (CMET)   Lipid panel   Hemoglobin A1c   TSH    Requested Prescriptions    No prescriptions requested or ordered in this encounter

## 2023-01-18 NOTE — Progress Notes (Unsigned)
  [name] [address] [place] [address] Phone: [phone] Subjective:   [name], am serving as a scribe for Dr. Antoine Primas.  I'm seeing this patient by the request  of:  [name], FNP  CC: Neck and back pain follow-up  [id number]  Kathy Dixon is a 42 y.o. female coming in with complaint of back and neck pain. OMT on 11/30/2022.  Known spondylolisthesis at L4-L5.  Patient states just came back from a field trip needs a full body adjustment   Medications patient has been prescribed:   Taking:         Reviewed prior external information including notes and imaging from previsou exam, outside providers and external EMR if available.   As well as notes that were available from care everywhere and other healthcare systems.  Past medical history, social, surgical and family history all reviewed in electronic medical record.  No pertanent information unless stated regarding to the chief complaint.   Past Medical History:  Diagnosis Date   History of varicella    Hypertension    no meds    No Known Allergies   Review of Systems:  No headache, visual changes, nausea, vomiting, diarrhea, constipation, dizziness, abdominal pain, skin rash, fevers, chills, night sweats, weight loss, swollen lymph nodes, body aches, joint swelling, chest pain, shortness of breath, mood changes. POSITIVE muscle aches  Objective  Blood pressure 134/80, pulse 88, height 5\' 3"  (1.6 m), weight 210 lb (95.3 kg), SpO2 100 %.   General: No apparent distress alert and oriented x3 mood and affect normal, dressed appropriately.  HEENT: Pupils equal, extraocular movements intact  Respiratory: Patient's speak in full sentences and does not appear short of breath  Cardiovascular: No lower extremity edema, non tender, no erythema  Low back exam shows mild loss of lordosis noted.  Does have worsening pain with extension of the back.  Tender  to palpation in the L4-S1 area bilaterally right greater than left.  Osteopathic findings  C3 flexed rotated and side bent right C7 flexed rotated and side bent left T3 extended rotated and side bent right inhaled rib T9 extended rotated and side bent left L2 flexed rotated and side bent right L4 flexed rotated and side bent right Sacrum right on right       Assessment and Plan:  Spondylolisthesis at L4-L5 level Patient does have a spinal disease of the back.  He does respond well to osteopathic manipulation.  Encouraged her to avoid repetitive extension of the back.  Increase activity slowly otherwise.  Follow-up again in 2 months    Nonallopathic problems  Decision today to treat with OMT was based on Physical Exam  After verbal consent patient was treated with HVLA, ME, FPR techniques in cervical, rib, thoracic, lumbar, and sacral  areas  Patient tolerated the procedure well with improvement in symptoms  Patient given exercises, stretches and lifestyle modifications  See medications in patient instructions if given  Patient will follow up in 4-8 weeks     The above documentation has been reviewed and is accurate and complete Judi Saa, DO         Note: This dictation was prepared with Dragon dictation along with smaller phrase technology. Any transcriptional errors that result from this process are unintentional.

## 2023-01-19 ENCOUNTER — Ambulatory Visit: Payer: 59 | Admitting: Family Medicine

## 2023-01-19 ENCOUNTER — Encounter: Payer: Self-pay | Admitting: Family Medicine

## 2023-01-19 VITALS — BP 134/80 | HR 88 | Ht 63.0 in | Wt 210.0 lb

## 2023-01-19 DIAGNOSIS — M9908 Segmental and somatic dysfunction of rib cage: Secondary | ICD-10-CM

## 2023-01-19 DIAGNOSIS — M9903 Segmental and somatic dysfunction of lumbar region: Secondary | ICD-10-CM | POA: Diagnosis not present

## 2023-01-19 DIAGNOSIS — M9904 Segmental and somatic dysfunction of sacral region: Secondary | ICD-10-CM | POA: Diagnosis not present

## 2023-01-19 DIAGNOSIS — M9902 Segmental and somatic dysfunction of thoracic region: Secondary | ICD-10-CM

## 2023-01-19 DIAGNOSIS — M9901 Segmental and somatic dysfunction of cervical region: Secondary | ICD-10-CM

## 2023-01-19 DIAGNOSIS — M4316 Spondylolisthesis, lumbar region: Secondary | ICD-10-CM

## 2023-01-19 NOTE — Patient Instructions (Signed)
Great to see you Have a great summer  Avoid repetitive extension of the back  6-8 week follow up

## 2023-01-19 NOTE — Assessment & Plan Note (Signed)
Patient does have a spinal disease of the back.  He does respond well to osteopathic manipulation.  Encouraged her to avoid repetitive extension of the back.  Increase activity slowly otherwise.  Follow-up again in 2 months

## 2023-03-09 NOTE — Progress Notes (Unsigned)
[name] [address] [place] [address] Phone: [phone] Subjective:   [name], am serving as a scribe for Dr. Antoine Primas.  I'm seeing this patient by the request  of:  [name], FNP  CC: Low back pain follow-up  [id number]  Kathy Dixon is a 42 y.o. female coming in with complaint of back and neck pain. OMT on 01/19/2023. Patient states that she has been walking more recently. Purchased [employer] shoes and loves them. Wears them up until she goes to bed. Also is not eating foods with added sugar.   Does have some tightness in R side of lumbar spine at times with walking.   Medications patient has been prescribed:   Taking:         Reviewed prior external information including notes and imaging from previsou exam, outside providers and external EMR if available.   As well as notes that were available from care everywhere and other healthcare systems.  Past medical history, social, surgical and family history all reviewed in electronic medical record.  No pertanent information unless stated regarding to the chief complaint.   Past Medical History:  Diagnosis Date   History of varicella    Hypertension    no meds    No Known Allergies   Review of Systems:  No headache, visual changes, nausea, vomiting, diarrhea, constipation, dizziness, abdominal pain, skin rash, fevers, chills, night sweats, weight loss, swollen lymph nodes, body aches, joint swelling, chest pain, shortness of breath, mood changes. POSITIVE muscle aches  Objective  Blood pressure 118/72, pulse 73, height 5\' 3"  (1.6 m), weight 208 lb (94.3 kg), SpO2 98%.   General: No apparent distress alert and oriented x3 mood and affect normal, dressed appropriately.  HEENT: Pupils equal, extraocular movements intact  Respiratory: Patient's speak in full sentences and does not appear short of breath  Cardiovascular: No lower extremity edema, non  tender, no erythema  Low back exam does have some loss lordosis noted.  Chronic problem show is still increasing discomfort with any type of extension of the back.  Does have some discomfort also with even flexion of neck.  This seems to be somewhat worse than usual.  Osteopathic findings  C2 flexed rotated and side bent right C6 flexed rotated and side bent left T3 extended rotated and side bent right inhaled rib T9 extended rotated and side bent left L2 flexed rotated and side bent right Sacrum right on right       Assessment and Plan:  Spondylolisthesis at L4-L5 level Chronic problem with mild exacerbation noted.  Patient is having some more tightness noted but has made some progress with strengthening.  Discussed which activities to do and which ones to avoid.  Increase activity slowly.  No change in medications.  Follow-up again in 6 to 8 weeks    Nonallopathic problems  Decision today to treat with OMT was based on Physical Exam  After verbal consent patient was treated with HVLA, ME, FPR techniques in cervical, rib, thoracic, lumbar, and sacral  areas  Patient tolerated the procedure well with improvement in symptoms  Patient given exercises, stretches and lifestyle modifications  See medications in patient instructions if given  Patient will follow up in 4-8 weeks     The above documentation has been reviewed and is accurate and complete Judi Saa, DO         Note: This dictation was prepared with Dragon dictation along with  smaller phrase technology. Any transcriptional errors that result from this process are unintentional.

## 2023-03-16 ENCOUNTER — Ambulatory Visit: Payer: 59 | Admitting: Family Medicine

## 2023-03-16 VITALS — BP 118/72 | HR 73 | Ht 63.0 in | Wt 208.0 lb

## 2023-03-16 DIAGNOSIS — M9903 Segmental and somatic dysfunction of lumbar region: Secondary | ICD-10-CM

## 2023-03-16 DIAGNOSIS — M9908 Segmental and somatic dysfunction of rib cage: Secondary | ICD-10-CM | POA: Diagnosis not present

## 2023-03-16 DIAGNOSIS — M9901 Segmental and somatic dysfunction of cervical region: Secondary | ICD-10-CM | POA: Diagnosis not present

## 2023-03-16 DIAGNOSIS — M9904 Segmental and somatic dysfunction of sacral region: Secondary | ICD-10-CM | POA: Diagnosis not present

## 2023-03-16 DIAGNOSIS — M4316 Spondylolisthesis, lumbar region: Secondary | ICD-10-CM | POA: Diagnosis not present

## 2023-03-16 DIAGNOSIS — M9902 Segmental and somatic dysfunction of thoracic region: Secondary | ICD-10-CM | POA: Diagnosis not present

## 2023-03-16 NOTE — Assessment & Plan Note (Addendum)
Chronic problem with mild exacerbation noted.  Patient is having some more tightness noted but has made some progress with strengthening.  Discussed which activities to do and which ones to avoid.  Increase activity slowly.  No change in medications.  Follow-up again in 6 to 8 weeks

## 2023-03-16 NOTE — Patient Instructions (Signed)
Good to see you Smaller strides uphill Watch lead foot See me in 2-3 months

## 2023-03-17 ENCOUNTER — Encounter: Payer: Self-pay | Admitting: Family Medicine

## 2023-05-17 NOTE — Progress Notes (Unsigned)
  [name] [address] [place] [address] Phone: [phone] Subjective:   Kathy Dixon, am serving as a scribe for Dr. Antoine Primas.  I'm seeing this patient by the request  of:  [name], FNP  CC: Back and neck pain follow  [id number]  Kathy Dixon is a 42 y.o. female coming in with complaint of back and neck pain. OMT on 03/16/2023.  Known to have spondylolisthesis of L4-5.  Patient states that she has been trying to exercise more but got sick. Has been doing ok overall.   Medications patient has been prescribed:   Taking:         Reviewed prior external information including notes and imaging from previsou exam, outside providers and external EMR if available.   As well as notes that were available from care everywhere and other healthcare systems.  Past medical history, social, surgical and family history all reviewed in electronic medical record.  No pertanent information unless stated regarding to the chief complaint.   Past Medical History:  Diagnosis Date   History of varicella    Hypertension    no meds    No Known Allergies   Review of Systems:  No headache, visual changes, nausea, vomiting, diarrhea, constipation, dizziness, abdominal pain, skin rash, fevers, chills, night sweats, weight loss, swollen lymph nodes, body aches, joint swelling, chest pain, shortness of breath, mood changes. POSITIVE muscle aches  Objective  Blood pressure 106/72, pulse 78, height 5\' 3"  (1.6 m), weight 201 lb (91.2 kg), SpO2 98%.   General: No apparent distress alert and oriented x3 mood and affect normal, dressed appropriately.  HEENT: Pupils equal, extraocular movements intact  Respiratory: Patient's speak in full sentences and does not appear short of breath  Cardiovascular: No lower extremity edema, non tender, no erythema  Gait relatively normal MSK:  Back exam still shows some very mild loss of lordosis.   Still tightness noted in the paraspinal musculature.  Tightness with FABER.  Worsening pain with extension of the back  Osteopathic findings  C2 flexed rotated and side bent right C6 flexed rotated and side bent left T3 extended rotated and side bent right inhaled rib T9 extended rotated and side bent left L2 flexed rotated and side bent right Sacrum right on right       Assessment and Plan:  Spondylolisthesis at L4-L5 level Has been doing relatively well overall.  Discussed icing regimen and home exercises.  Discussed which activities to do and which ones to avoid.  Continue to work on core strengthening.  Patient is still trying to lose weight and has been doing remarkably well in my opinion.  Follow-up again in 2 months    Nonallopathic problems  Decision today to treat with OMT was based on Physical Exam  After verbal consent patient was treated with HVLA, ME, FPR techniques in cervical, rib, thoracic, lumbar, and sacral  areas  Patient tolerated the procedure well with improvement in symptoms  Patient given exercises, stretches and lifestyle modifications  See medications in patient instructions if given  Patient will follow up in 4-8 weeks     The above documentation has been reviewed and is accurate and complete Judi Saa, DO         Note: This dictation was prepared with Dragon dictation along with smaller phrase technology. Any transcriptional errors that result from this process are unintentional.

## 2023-05-18 ENCOUNTER — Ambulatory Visit: Payer: 59 | Admitting: Family Medicine

## 2023-05-18 ENCOUNTER — Encounter: Payer: Self-pay | Admitting: Family Medicine

## 2023-05-18 VITALS — BP 106/72 | HR 78 | Ht 63.0 in | Wt 201.0 lb

## 2023-05-18 DIAGNOSIS — M4316 Spondylolisthesis, lumbar region: Secondary | ICD-10-CM

## 2023-05-18 DIAGNOSIS — M9903 Segmental and somatic dysfunction of lumbar region: Secondary | ICD-10-CM

## 2023-05-18 DIAGNOSIS — M9904 Segmental and somatic dysfunction of sacral region: Secondary | ICD-10-CM

## 2023-05-18 DIAGNOSIS — M9902 Segmental and somatic dysfunction of thoracic region: Secondary | ICD-10-CM

## 2023-05-18 DIAGNOSIS — M9901 Segmental and somatic dysfunction of cervical region: Secondary | ICD-10-CM

## 2023-05-18 DIAGNOSIS — M9908 Segmental and somatic dysfunction of rib cage: Secondary | ICD-10-CM | POA: Diagnosis not present

## 2023-05-18 NOTE — Patient Instructions (Signed)
Good luck with PTA  Keep what you are doing  See me in 2-3 months

## 2023-05-18 NOTE — Assessment & Plan Note (Signed)
Has been doing relatively well overall.  Discussed icing regimen and home exercises.  Discussed which activities to do and which ones to avoid.  Continue to work on core strengthening.  Patient is still trying to lose weight and has been doing remarkably well in my opinion.  Follow-up again in 2 months

## 2023-08-02 NOTE — Progress Notes (Unsigned)
  [facility] [address] [place] [address] Phone: [phone] Subjective:   [name], am serving as a scribe for Dr. Antoine Primas.  I'm seeing this patient by the request  of:  [name], FNP  CC: Back and neck pain follow-up  [id number]  Kathy Dixon is a 42 y.o. female coming in with complaint of back and neck pain. OMT on 05/18/2023. Patient states same per usual. No new concerns.  Patient has had some tightness but nothing severe.  Does still notice that with some worsening pain can seem to give some difficulty.          Reviewed prior external information including notes and imaging from previsou exam, outside providers and external EMR if available.   As well as notes that were available from care everywhere and other healthcare systems.  Past medical history, social, surgical and family history all reviewed in electronic medical record.  No pertanent information unless stated regarding to the chief complaint.   Past Medical History:  Diagnosis Date   History of varicella    Hypertension    no meds    No Known Allergies   Review of Systems:  No headache, visual changes, nausea, vomiting, diarrhea, constipation, dizziness, abdominal pain, skin rash, fevers, chills, night sweats, weight loss, swollen lymph nodes, body aches, joint swelling, chest pain, shortness of breath, mood changes. POSITIVE muscle aches  Objective  Blood pressure 102/72, pulse 87, height 5\' 3"  (1.6 m), weight 205 lb (93 kg), SpO2 98%.   General: No apparent distress alert and oriented x3 mood and affect normal, dressed appropriately.  HEENT: Pupils equal, extraocular movements intact  Respiratory: Patient's speak in full sentences and does not appear short of breath  Cardiovascular: No lower extremity edema, non tender, no erythema  Gait relatively normal MSK:  Back does have some loss lordosis noted.  Some tenderness to palpation  in the paraspinal musculature.  Osteopathic findings  C2 flexed rotated and side bent right C5 flexed rotated and side bent left T3 extended rotated and side bent right inhaled rib T9 extended rotated and side bent left L4 flexed rotated and side bent right L5 flexed rotated and side bent left Sacrum right on right    Assessment and Plan:  Spondylolisthesis at L4-L5 level Chronic spondylolisthesis.  Patient is doing well though with her core strength.  Has lost weight and is doing well with that.  Discussed icing regimen and home exercises, discussed which activities to do and which ones to avoid.  Follow-up again in 6 to 8 weeks otherwise.    Nonallopathic problems  Decision today to treat with OMT was based on Physical Exam  After verbal consent patient was treated with HVLA, ME, FPR techniques in cervical, rib, thoracic, lumbar, and sacral  areas  Patient tolerated the procedure well with improvement in symptoms  Patient given exercises, stretches and lifestyle modifications  See medications in patient instructions if given  Patient will follow up in 4-8 weeks      The above documentation has been reviewed and is accurate and complete Judi Saa, DO        Note: This dictation was prepared with Dragon dictation along with smaller phrase technology. Any transcriptional errors that result from this process are unintentional.

## 2023-08-03 ENCOUNTER — Encounter: Payer: Self-pay | Admitting: Family Medicine

## 2023-08-03 ENCOUNTER — Ambulatory Visit: Payer: 59 | Admitting: Family Medicine

## 2023-08-03 VITALS — BP 102/72 | HR 87 | Ht 63.0 in | Wt 205.0 lb

## 2023-08-03 DIAGNOSIS — M9908 Segmental and somatic dysfunction of rib cage: Secondary | ICD-10-CM

## 2023-08-03 DIAGNOSIS — M4316 Spondylolisthesis, lumbar region: Secondary | ICD-10-CM | POA: Diagnosis not present

## 2023-08-03 DIAGNOSIS — M9901 Segmental and somatic dysfunction of cervical region: Secondary | ICD-10-CM | POA: Diagnosis not present

## 2023-08-03 DIAGNOSIS — M9902 Segmental and somatic dysfunction of thoracic region: Secondary | ICD-10-CM | POA: Diagnosis not present

## 2023-08-03 DIAGNOSIS — M9904 Segmental and somatic dysfunction of sacral region: Secondary | ICD-10-CM | POA: Diagnosis not present

## 2023-08-03 DIAGNOSIS — M9903 Segmental and somatic dysfunction of lumbar region: Secondary | ICD-10-CM

## 2023-08-03 NOTE — Patient Instructions (Signed)
You're awesome You are great Make husband clean the house See you again in 2-3 months

## 2023-08-03 NOTE — Assessment & Plan Note (Signed)
Chronic spondylolisthesis.  Patient is doing well though with her core strength.  Has lost weight and is doing well with that.  Discussed icing regimen and home exercises, discussed which activities to do and which ones to avoid.  Follow-up again in 6 to 8 weeks otherwise.

## 2023-10-12 ENCOUNTER — Ambulatory Visit: Payer: 59 | Admitting: Family Medicine

## 2023-10-17 NOTE — Progress Notes (Signed)
  [name] [address] [place] [address] Phone: [phone] Subjective:   Kathy Dixon, am serving as a scribe for Dr. Antoine Primas.  I'm seeing this patient by the request  of:  [name], FNP  CC: Back and neck pain follow-up  [id number]  Kathy Dixon is a 43 y.o. female coming in with complaint of back and neck pain. OMT 08/03/2023. Patient states has had some tightness recently but nothing severe.  Patient did have a stomach flu that did make her not quite as active as usual.  Maybe some mild tightness but nothing severe.  Medications patient has been prescribed: None  Taking:         Reviewed prior external information including notes and imaging from previsou exam, outside providers and external EMR if available.   As well as notes that were available from care everywhere and other healthcare systems.  Past medical history, social, surgical and family history all reviewed in electronic medical record.  No pertanent information unless stated regarding to the chief complaint.   Past Medical History:  Diagnosis Date   History of varicella    Hypertension    no meds    No Known Allergies   Review of Systems:  No headache, visual changes, nausea, vomiting, diarrhea, constipation, dizziness, abdominal pain, skin rash, fevers, chills, night sweats, weight loss, swollen lymph nodes, body aches, joint swelling, chest pain, shortness of breath, mood changes. POSITIVE muscle aches  Objective  Blood pressure 108/68, pulse 76, height 5\' 3"  (1.6 m), weight 205 lb (93 kg), SpO2 97%.   General: No apparent distress alert and oriented x3 mood and affect normal, dressed appropriately.  HEENT: Pupils equal, extraocular movements intact  Respiratory: Patient's speak in full sentences and does not appear short of breath  Cardiovascular: No lower extremity edema, non tender, no erythema  Gait relatively normal MSK:   Back does have some loss lordosis noted.  Some tenderness to palpation in the paraspinal musculature.  Positive FABER test.  Osteopathic findings  C2 flexed rotated and side bent right C6 flexed rotated and side bent left T3 extended rotated and side bent right inhaled rib T7 extended rotated and side bent left L2 flexed rotated and side bent right Sacrum right on right       Assessment and Plan:  Spondylolisthesis at L4-L5 level Patient has been doing remarkably well overall.  Has been a little bit less active.  We discussed with patient that icing regimen and home exercises, which activities to do and which ones to avoid.  Increase activity slowly.  Follow-up again in 6 to 8 weeks    Nonallopathic problems  Decision today to treat with OMT was based on Physical Exam  After verbal consent patient was treated with HVLA, ME, FPR techniques in cervical, rib, thoracic, lumbar, and sacral  areas  Patient tolerated the procedure well with improvement in symptoms  Patient given exercises, stretches and lifestyle modifications  See medications in patient instructions if given  Patient will follow up in 4-8 weeks    The above documentation has been reviewed and is accurate and complete Kathy Saa, DO          Note: This dictation was prepared with Dragon dictation along with smaller phrase technology. Any transcriptional errors that result from this process are unintentional.

## 2023-10-26 ENCOUNTER — Ambulatory Visit: Payer: 59 | Admitting: Family Medicine

## 2023-10-26 ENCOUNTER — Encounter: Payer: Self-pay | Admitting: Family Medicine

## 2023-10-26 VITALS — BP 108/68 | HR 76 | Ht 63.0 in | Wt 205.0 lb

## 2023-10-26 DIAGNOSIS — M4316 Spondylolisthesis, lumbar region: Secondary | ICD-10-CM | POA: Diagnosis not present

## 2023-10-26 DIAGNOSIS — M9903 Segmental and somatic dysfunction of lumbar region: Secondary | ICD-10-CM | POA: Diagnosis not present

## 2023-10-26 DIAGNOSIS — M9908 Segmental and somatic dysfunction of rib cage: Secondary | ICD-10-CM

## 2023-10-26 DIAGNOSIS — M9902 Segmental and somatic dysfunction of thoracic region: Secondary | ICD-10-CM | POA: Diagnosis not present

## 2023-10-26 DIAGNOSIS — M9904 Segmental and somatic dysfunction of sacral region: Secondary | ICD-10-CM | POA: Diagnosis not present

## 2023-10-26 DIAGNOSIS — M9901 Segmental and somatic dysfunction of cervical region: Secondary | ICD-10-CM

## 2023-10-26 NOTE — Patient Instructions (Signed)
 Good to see you! Thanks for letting me be your travel agent See you again in 2-3 months

## 2023-10-26 NOTE — Assessment & Plan Note (Signed)
 Patient has been doing remarkably well overall.  Has been a little bit less active.  We discussed with patient that icing regimen and home exercises, which activities to do and which ones to avoid.  Increase activity slowly.  Follow-up again in 6 to 8 weeks

## 2023-11-26 IMAGING — MG MM DIGITAL DIAGNOSTIC UNILAT*L* W/ TOMO W/ CAD
6 series · 6 of 18 positions shown · non-contrast
Comparison: Previous exam(s).

CLINICAL DATA: Possible asymmetry in the upper left breast in the
oblique projection of a recent screening mammogram.

EXAM:
DIGITAL DIAGNOSTIC UNILATERAL LEFT MAMMOGRAM WITH TOMOSYNTHESIS AND
CAD
TECHNIQUE: Left digital diagnostic mammography and breast tomosynthesis was
performed. The images were evaluated with computer-aided detection.

[L MLO synth-2D (1 of 2)]
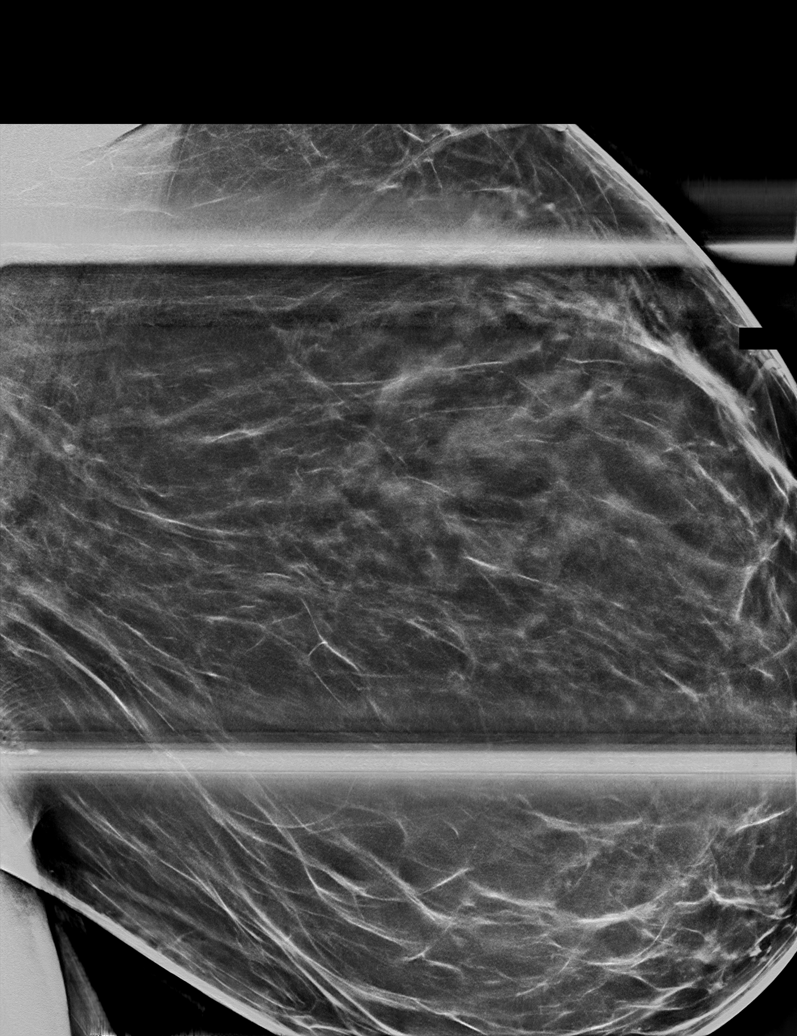

[L ML synth-2D]
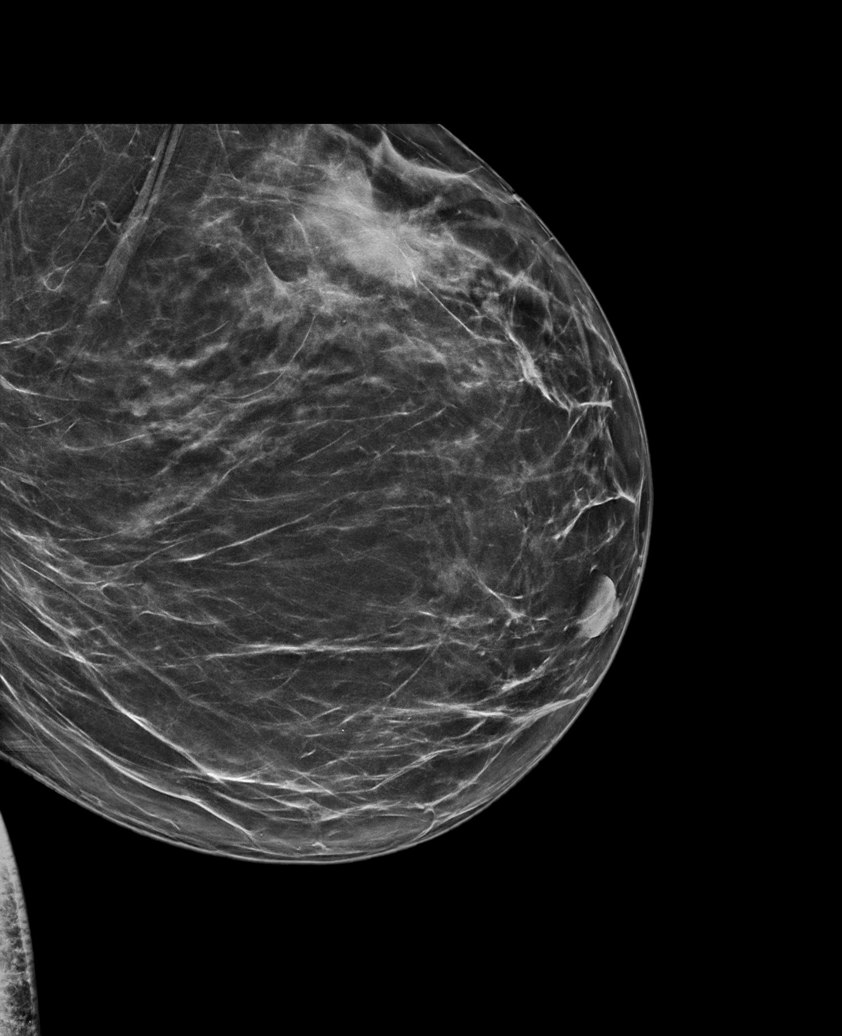

[L MLO synth-2D (2 of 2)]
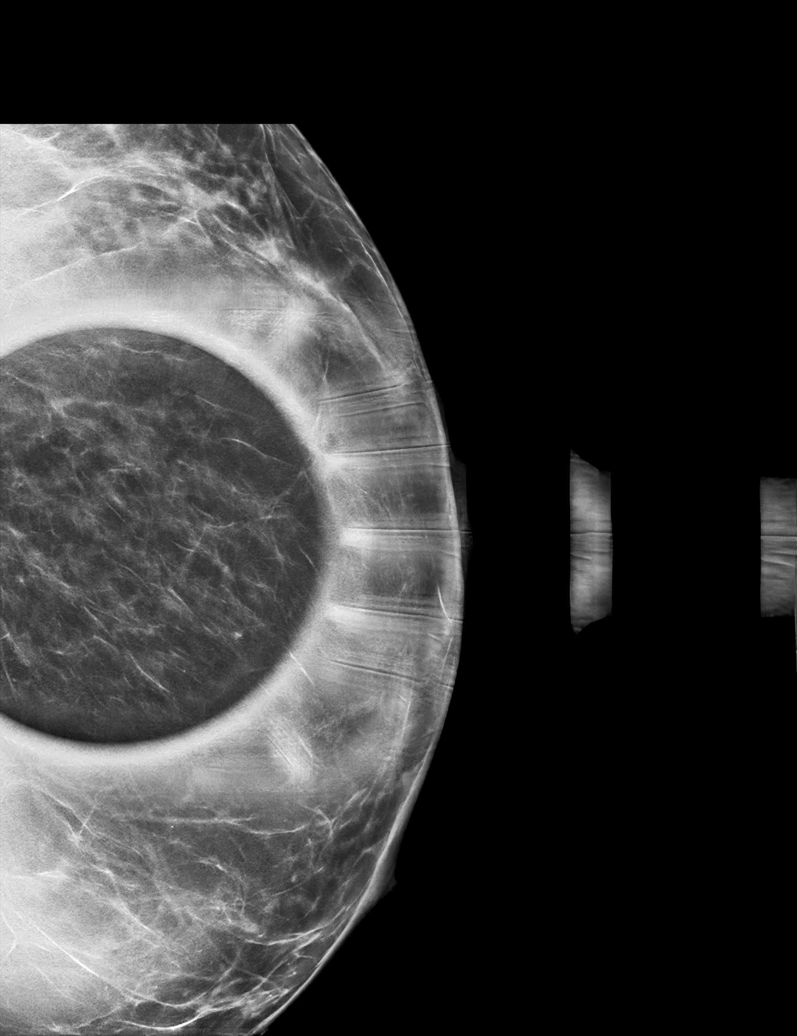

[L ML tomo · tomo slice 41/82.0]
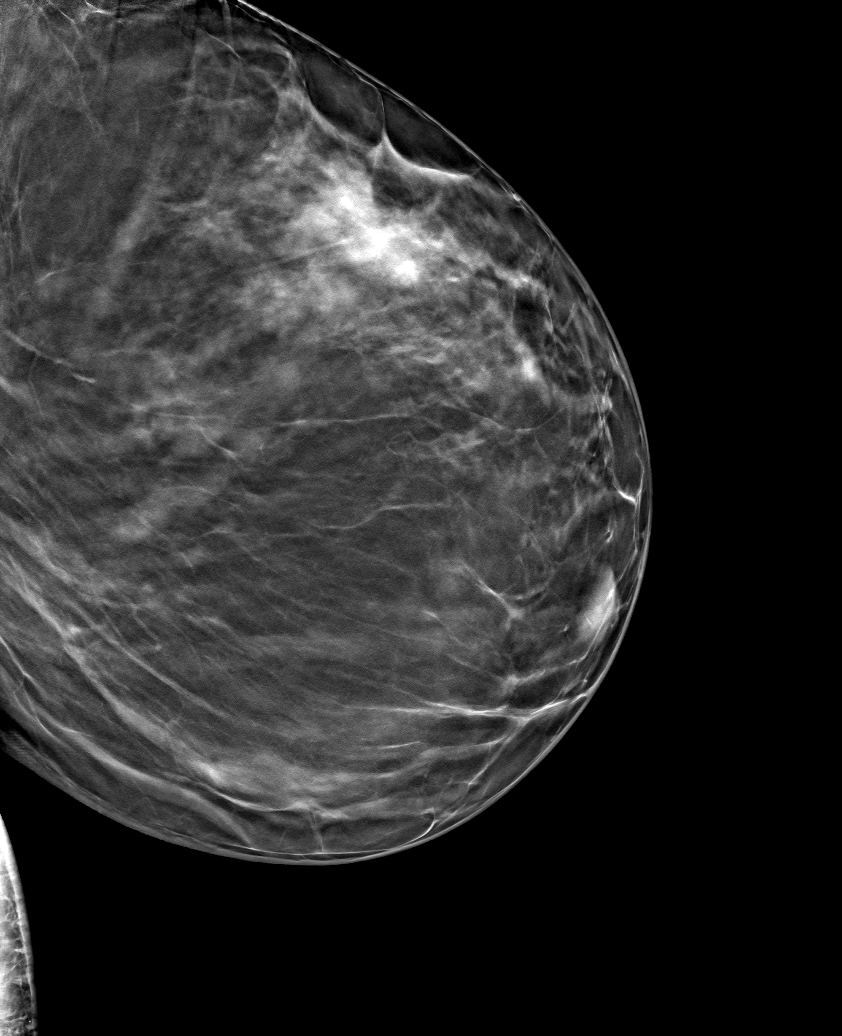

[L MLO tomo (1 of 2) · tomo slice 42/83.0]
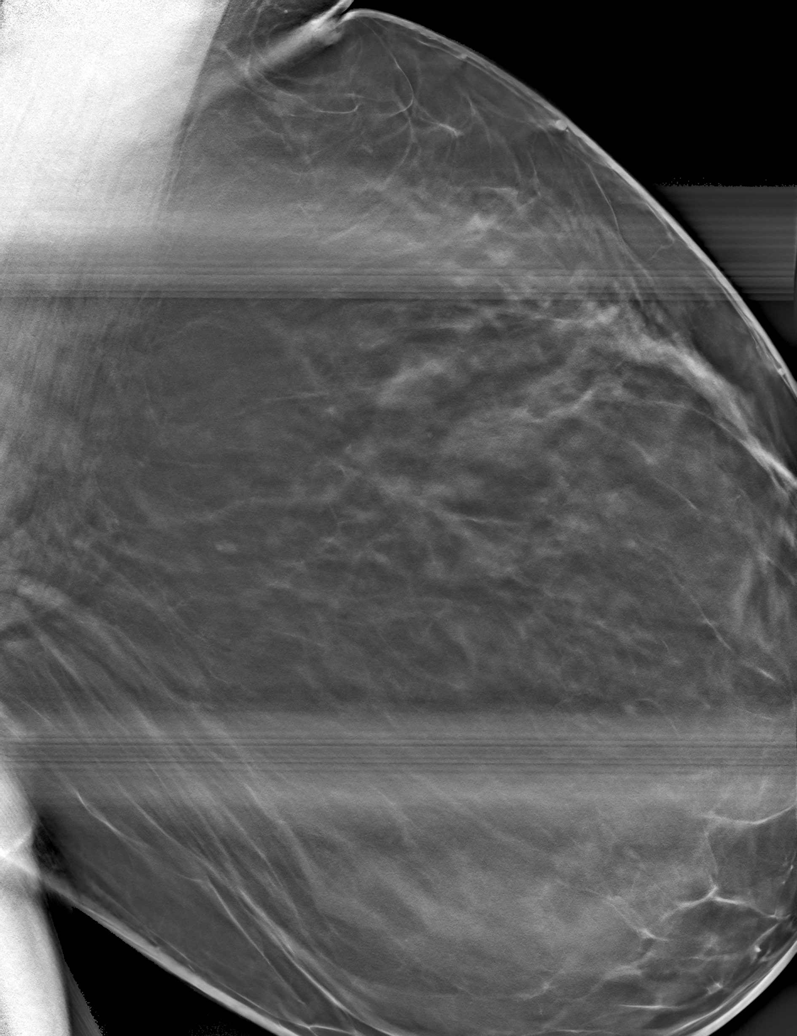

[L MLO tomo (2 of 2) · tomo slice 37/72.0]
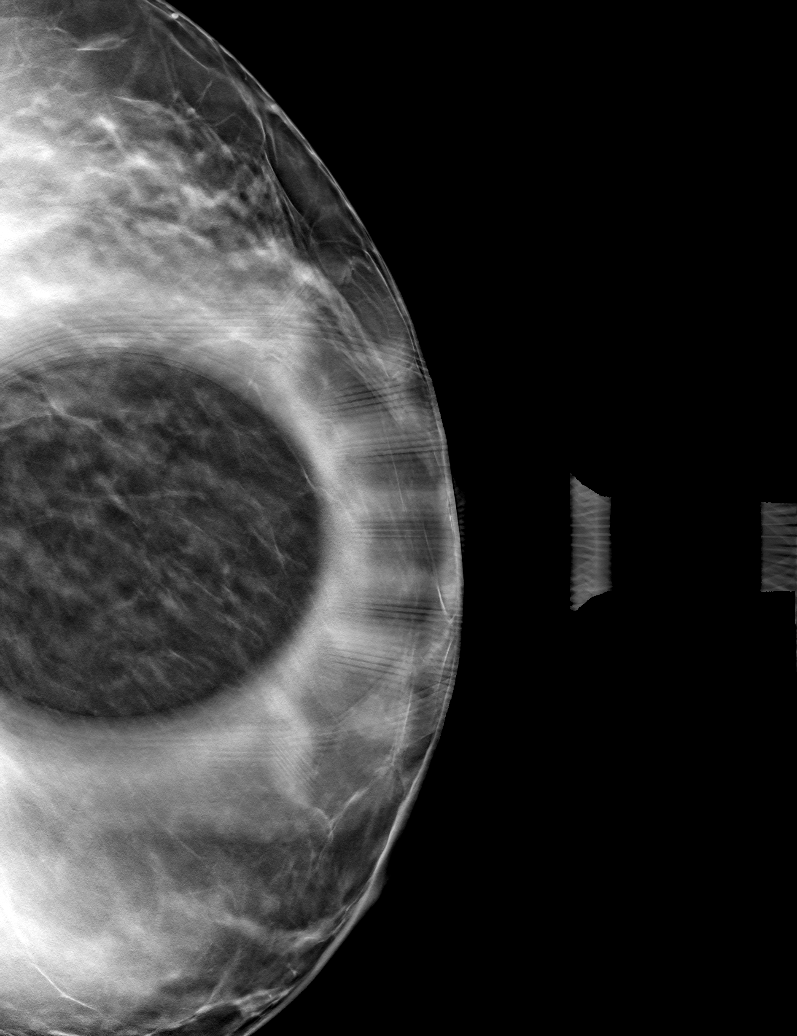

[6 of 18 positions shown; findings below may reference images not displayed]

ACR Breast Density Category b: There are scattered areas of
fibroglandular density.
FINDINGS: 3D tomographic and 2D generated true lateral and spot compression
oblique images of the left breast demonstrate normal appearing
fibroglandular tissue at the location of the recently suspected
asymmetry, unchanged compared to previous examinations.
IMPRESSION: No evidence of malignancy. The recently suspected left breast
asymmetry was close apposition of normal breast tissue.

RECOMMENDATION:
Bilateral screening mammogram in September 2022 when due.

I have discussed the findings and recommendations with the patient.
If applicable, a reminder letter will be sent to the patient
regarding the next appointment.

BI-RADS CATEGORY  1: Negative.

## 2023-12-01 ENCOUNTER — Other Ambulatory Visit: Payer: Self-pay | Admitting: Obstetrics and Gynecology

## 2023-12-01 DIAGNOSIS — E049 Nontoxic goiter, unspecified: Secondary | ICD-10-CM

## 2023-12-05 ENCOUNTER — Ambulatory Visit
Admission: RE | Admit: 2023-12-05 | Discharge: 2023-12-05 | Disposition: A | Discharge status: Home / Self Care | Source: Ambulatory Visit | Attending: Obstetrics and Gynecology | Admitting: Obstetrics and Gynecology

## 2023-12-05 DIAGNOSIS — E049 Nontoxic goiter, unspecified: Secondary | ICD-10-CM

## 2024-01-03 NOTE — Progress Notes (Deleted)
  [facility] [address] [place] [address] Phone: [phone] Subjective:    I'm seeing this patient by the request  of:  Adra Alanis, FNP  CC: Back and neck pain follow-up  [id number]  Kathy Dixon is a 43 y.o. female coming in with complaint of back and neck pain. OMT on 10/26/2023.  Known spondylolisthesis at L4-L5.  Has had significant neck tightness as well secondary to ergonomics.  Patient states   Medications patient has been prescribed:   Taking:         Reviewed prior external information including notes and imaging from previsou exam, outside providers and external EMR if available.   As well as notes that were available from care everywhere and other healthcare systems.  Past medical history, social, surgical and family history all reviewed in electronic medical record.  No pertanent information unless stated regarding to the chief complaint.   Past Medical History:  Diagnosis Date   History of varicella    Hypertension    no meds    No Known Allergies   Review of Systems:  No headache, visual changes, nausea, vomiting, diarrhea, constipation, dizziness, abdominal pain, skin rash, fevers, chills, night sweats, weight loss, swollen lymph nodes, body aches, joint swelling, chest pain, shortness of breath, mood changes. POSITIVE muscle aches  Objective  There were no vitals taken for this visit.   General: No apparent distress alert and oriented x3 mood and affect normal, dressed appropriately.  HEENT: Pupils equal, extraocular movements intact  Respiratory: Patient's speak in full sentences and does not appear short of breath  Cardiovascular: No lower extremity edema, non tender, no erythema  Gait MSK:  Back   Osteopathic findings  C2 flexed rotated and side bent right C6 flexed rotated and side bent left T3 extended rotated and side bent right inhaled rib T9 extended rotated and side bent left L2  flexed rotated and side bent right Sacrum right on right       Assessment and Plan:  No problem-specific Assessment & Plan notes found for this encounter.    Nonallopathic problems  Decision today to treat with OMT was based on Physical Exam  After verbal consent patient was treated with HVLA, ME, FPR techniques in cervical, rib, thoracic, lumbar, and sacral  areas  Patient tolerated the procedure well with improvement in symptoms  Patient given exercises, stretches and lifestyle modifications  See medications in patient instructions if given  Patient will follow up in 4-8 weeks     The above documentation has been reviewed and is accurate and complete Sigurd Pugh M Ronalee Scheunemann, DO         Note: This dictation was prepared with Dragon dictation along with smaller phrase technology. Any transcriptional errors that result from this process are unintentional.

## 2024-01-04 ENCOUNTER — Ambulatory Visit: Admitting: Family Medicine

## 2024-03-13 NOTE — Progress Notes (Unsigned)
  [facility] [address] [place] [address] Phone: [phone] Subjective:   ISusannah Dixon, am serving as a scribe for Dr. Arthea Claudene.  I'm seeing this patient by the request  of:  Jason Leita Repine, FNP  CC: Back and neck pain  [id number]  Kathy Dixon is a 43 y.o. female coming in with complaint of back and neck pain. OMT on 10/26/2023. Patient states has been trying to do exercises and it has been helping.  Patient has not been doing the exercises and much secondary to being busy in life.        Past Medical History:  Diagnosis Date   History of varicella    Hypertension    no meds    No Known Allergies   Review of Systems:  No headache, visual changes, nausea, vomiting, diarrhea, constipation, dizziness, abdominal pain, skin rash, fevers, chills, night sweats, weight loss, swollen lymph nodes, body aches, joint swelling, chest pain, shortness of breath, mood changes. POSITIVE muscle aches  Objective  Blood pressure 108/80, pulse 86, height 5' 3 (1.6 m), weight 210 lb (95.3 kg), SpO2 97%.   General: No apparent distress alert and oriented x3 mood and affect normal, dressed appropriately.  HEENT: Pupils equal, extraocular movements intact  Respiratory: Patient's speak in full sentences and does not appear short of breath  Cardiovascular: No lower extremity edema, non tender, no erythema  Gait MSK:  Back does have some loss of lordosis noted.  Patient does have some worsening pain with extension noted.  Osteopathic findings  C2 flexed rotated and side bent right C6 flexed rotated and side bent left T3 extended rotated and side bent right inhaled rib T9 extended rotated and side bent left L2 flexed rotated and side bent right Sacrum right on right       Assessment and Plan:  Spondylolisthesis at L4-L5 level Discussed avoiding repetitive extension noted.  Discussed which activities to do and which ones to  avoid.  Increase activity slowly otherwise.  Discussed the may be some strengthening of the core strength as well.  Follow-up again in 6 to 8 weeks.    Nonallopathic problems  Decision today to treat with OMT was based on Physical Exam  After verbal consent patient was treated with HVLA, ME, FPR techniques in cervical, rib, thoracic, lumbar, and sacral  areas avoided HVLA on the cervical region.  Patient tolerated the procedure well with improvement in symptoms  Patient given exercises, stretches and lifestyle modifications  See medications in patient instructions if given  Patient will follow up in 4-8 weeks     The above documentation has been reviewed and is accurate and complete Jerah Esty M Maxton Noreen, DO         Note: This dictation was prepared with Dragon dictation along with smaller phrase technology. Any transcriptional errors that result from this process are unintentional.

## 2024-03-14 ENCOUNTER — Encounter: Payer: Self-pay | Admitting: Family Medicine

## 2024-03-14 ENCOUNTER — Ambulatory Visit: Admitting: Family Medicine

## 2024-03-14 VITALS — BP 108/80 | HR 86 | Ht 63.0 in | Wt 210.0 lb

## 2024-03-14 DIAGNOSIS — M9904 Segmental and somatic dysfunction of sacral region: Secondary | ICD-10-CM | POA: Diagnosis not present

## 2024-03-14 DIAGNOSIS — M9901 Segmental and somatic dysfunction of cervical region: Secondary | ICD-10-CM

## 2024-03-14 DIAGNOSIS — M9902 Segmental and somatic dysfunction of thoracic region: Secondary | ICD-10-CM

## 2024-03-14 DIAGNOSIS — M4316 Spondylolisthesis, lumbar region: Secondary | ICD-10-CM

## 2024-03-14 DIAGNOSIS — M9908 Segmental and somatic dysfunction of rib cage: Secondary | ICD-10-CM

## 2024-03-14 DIAGNOSIS — M9903 Segmental and somatic dysfunction of lumbar region: Secondary | ICD-10-CM

## 2024-03-14 NOTE — Patient Instructions (Signed)
 Good to see you! Enjoy the rest of summer Start using the passport See you again in 7-8 weeks

## 2024-03-14 NOTE — Assessment & Plan Note (Signed)
 Discussed avoiding repetitive extension noted.  Discussed which activities to do and which ones to avoid.  Increase activity slowly otherwise.  Discussed the may be some strengthening of the core strength as well.  Follow-up again in 6 to 8 weeks.

## 2024-05-01 ENCOUNTER — Ambulatory Visit: Admitting: Family

## 2024-05-01 ENCOUNTER — Ambulatory Visit: Payer: Self-pay | Admitting: Family

## 2024-05-01 VITALS — BP 134/82 | HR 68 | Temp 98.5°F | Ht 63.0 in | Wt 214.0 lb

## 2024-05-01 DIAGNOSIS — R5383 Other fatigue: Secondary | ICD-10-CM | POA: Diagnosis not present

## 2024-05-01 DIAGNOSIS — R7303 Prediabetes: Secondary | ICD-10-CM

## 2024-05-01 DIAGNOSIS — Z0001 Encounter for general adult medical examination with abnormal findings: Secondary | ICD-10-CM

## 2024-05-01 DIAGNOSIS — Z13220 Encounter for screening for lipoid disorders: Secondary | ICD-10-CM | POA: Diagnosis not present

## 2024-05-01 DIAGNOSIS — Z23 Encounter for immunization: Secondary | ICD-10-CM

## 2024-05-01 DIAGNOSIS — Z0000 Encounter for general adult medical examination without abnormal findings: Secondary | ICD-10-CM

## 2024-05-01 LAB — HEMOGLOBIN A1C: Hgb A1c MFr Bld: 6.2 % (ref 4.6–6.5)

## 2024-05-01 LAB — TSH: TSH: 1.66 u[IU]/mL (ref 0.35–5.50)

## 2024-05-01 LAB — CBC WITH DIFFERENTIAL/PLATELET
Basophils Absolute: 0 K/uL (ref 0.0–0.1)
Basophils Relative: 0.3 % (ref 0.0–3.0)
Eosinophils Absolute: 0.1 K/uL (ref 0.0–0.7)
Eosinophils Relative: 1.2 % (ref 0.0–5.0)
HCT: 40.7 % (ref 36.0–46.0)
Hemoglobin: 13.6 g/dL (ref 12.0–15.0)
Lymphocytes Relative: 36.5 % (ref 12.0–46.0)
Lymphs Abs: 2.6 K/uL (ref 0.7–4.0)
MCHC: 33.3 g/dL (ref 30.0–36.0)
MCV: 90.4 fl (ref 78.0–100.0)
Monocytes Absolute: 0.5 K/uL (ref 0.1–1.0)
Monocytes Relative: 7.4 % (ref 3.0–12.0)
Neutro Abs: 3.9 K/uL (ref 1.4–7.7)
Neutrophils Relative %: 54.6 % (ref 43.0–77.0)
Platelets: 314 K/uL (ref 150.0–400.0)
RBC: 4.51 Mil/uL (ref 3.87–5.11)
RDW: 13.7 % (ref 11.5–15.5)
WBC: 7.2 K/uL (ref 4.0–10.5)

## 2024-05-01 LAB — LIPID PANEL
Cholesterol: 153 mg/dL (ref 0–200)
HDL: 54 mg/dL (ref >39.00)
LDL Cholesterol: 86 mg/dL (ref 0–99)
NonHDL: 98.82
Total CHOL/HDL Ratio: 3
Triglycerides: 64 mg/dL (ref 0.0–149.0)
VLDL: 12.8 mg/dL (ref 0.0–40.0)

## 2024-05-01 LAB — COMPREHENSIVE METABOLIC PANEL WITH GFR
ALT: 20 U/L (ref 0–35)
AST: 15 U/L (ref 0–37)
Albumin: 4.4 g/dL (ref 3.5–5.2)
Alkaline Phosphatase: 51 U/L (ref 39–117)
BUN: 11 mg/dL (ref 6–23)
CO2: 29 meq/L (ref 19–32)
Calcium: 9.6 mg/dL (ref 8.4–10.5)
Chloride: 101 meq/L (ref 96–112)
Creatinine, Ser: 0.73 mg/dL (ref 0.40–1.20)
GFR: 100.01 mL/min (ref >60.00)
Glucose, Bld: 84 mg/dL (ref 70–99)
Potassium: 4.1 meq/L (ref 3.5–5.1)
Sodium: 137 meq/L (ref 135–145)
Total Bilirubin: 0.5 mg/dL (ref 0.2–1.2)
Total Protein: 7.8 g/dL (ref 6.0–8.3)

## 2024-05-01 NOTE — Progress Notes (Unsigned)
 [facility] [address] [place] [address] Phone: [phone] Subjective:   Kathy Dixon am a scribe for Dr. Claudene.   I'm seeing this patient by the request  of:  Jason Leita Repine, FNP  CC: Back and neck pain follow-up  YEP:Dlagzrupcz  Kathy Dixon is a 43 y.o. female coming in with complaint of back and neck pain. OMT on 03/14/2024. Patient states neck is fine. Back is ok just needs some work.   Medications patient has been prescribed:   Taking:         Reviewed prior external information including notes and imaging from previsou exam, outside providers and external EMR if available.   As well as notes that were available from care everywhere and other healthcare systems.  Past medical history, social, surgical and family history all reviewed in electronic medical record.  No pertanent information unless stated regarding to the chief complaint.   Past Medical History:  Diagnosis Date   History of varicella    Hypertension    no meds    No Known Allergies   Review of Systems:  No headache, visual changes, nausea, vomiting, diarrhea, constipation, dizziness, abdominal pain, skin rash, fevers, chills, night sweats, weight loss, swollen lymph nodes, body aches, joint swelling, chest pain, shortness of breath, mood changes. POSITIVE muscle aches  Objective  Blood pressure 122/84, pulse 93, height 5' 3 (1.6 m), weight 212 lb (96.2 kg), SpO2 99%.   General: No apparent distress alert and oriented x3 mood and affect normal, dressed appropriately.  HEENT: Pupils equal, extraocular movements intact  Respiratory: Patient's speak in full sentences and does not appear short of breath  Cardiovascular: No lower extremity edema, non tender, no erythema  Gait MSK:  Back does have some loss lordosis noted.  Some tenderness to palpation of the paraspinal musculature.  Still seems to be more on the right than the left.  Seems to be  in the thoracolumbar juncture but also right lower than the lumbosacral area.  Positive pain over the sacrum itself.  Osteopathic findings  C4 flexed rotated and side bent right C5 flexed rotated and side bent left T3 extended rotated and side bent right inhaled rib T11 extended rotated and side bent right L1 flexed rotated and side bent right Sacrum right on right       Assessment and Plan:  Spondylolisthesis at L4-L5 level History of the spondylolisthesis.  Discussed icing regimen and home exercises.  Discussed increasing activity slowly.  Discussed which activities to do and which ones to avoid.  Increase activity slowly.  Discussed icing regimen.  Follow-up again in 6 to 8 weeks patient is going to work and losing weight.  Wants to get BMI under 30.  Going to start working with a nutritionist.    Nonallopathic problems  Decision today to treat with OMT was based on Physical Exam  After verbal consent patient was treated with HVLA, ME, FPR techniques in cervical, rib, thoracic, lumbar, and sacral  areas  Patient tolerated the procedure well with improvement in symptoms  Patient given exercises, stretches and lifestyle modifications  See medications in patient instructions if given  Patient will follow up in 4-8 weeks    The above documentation has been reviewed and is accurate and complete Kathy Dixon M Buford Gayler, DO          Note: This dictation was prepared with Dragon dictation along with smaller phrase technology. Any transcriptional errors that result from this  process are unintentional.

## 2024-05-01 NOTE — Progress Notes (Signed)
 Kathy Dixon is a 43 y.o. 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     Multiple Vitamin (MULTIVITAMIN) tablet Take 1 tablet by mouth daily.     No current facility-administered medications for this visit.    Allergies: Patient has no known allergies.  Past Medical History:  Diagnosis Date   History of varicella    Hypertension    no meds    Past Surgical History:  Procedure Laterality Date   CESAREAN SECTION N/A 09/20/2016   Procedure: CESAREAN SECTION;  Surgeon: Lynwood Clubs, MD;  Location: [facility]IR[facility]ING SU[facility];  Service: Obstetrics;  Laterality: N/A;    Family History  Problem Relation Age of Onset   Hypertension Mother    Hypertension Father    Other Father        myelofibrosis   Hypertension Sister    Diabetes Maternal Aunt    Stroke Maternal Aunt    Stroke Maternal Uncle     Social History   Tobacco Use   Smoking status: Never   Smokeless tobacco: Never  Substance Use Topics   Alcohol use: No    Comment: occ    Subjective:   Presents for yearly CPE; does have GYN- [facility]/ last there in June 2025; needs biometric screening form;  Continuing to work with sports medicine for chronic back issues;  Known history of pre-diabetes- would like to meet with nutritionist;   Review of Systems  Constitutional: Negative.   HENT: Negative.    Eyes: Negative.   Respiratory: Negative.    Cardiovascular: Negative.   Gastrointestinal: Negative.   Genitourinary: Negative.   Musculoskeletal: Negative.   Skin: Negative.   Neurological: Negative.    Endo/Heme/Allergies: Negative.   Psychiatric/Behavioral: Negative.       Objective:  Vitals:   05/01/24 0942  BP: 134/82  Pulse: 68  Temp: 98.5 F (36.9 C)  TempSrc: Oral  SpO2: 100%  Weight: 214 lb (97.1 kg)  Height: 5' 3 (1.6 m)    General: Well developed, well nourished, in no acute distress  Skin : Warm and dry.  Head: Normocephalic and atraumatic  Eyes: Sclera and conjunctiva clear; pupils round and reactive to light; extraocular movements intact  Ears: External normal; canals clear; tympanic membranes normal  Oropharynx: Pink, supple. No suspicious lesions  Neck: Supple without thyromegaly, adenopathy  Lungs: Respirations unlabored; clear to auscultation bilaterally without wheeze, rales, rhonchi  CVS exam: normal rate and regular rhythm.  Abdomen: Soft; nontender; nondistended; normoactive bowel sounds; no masses or hepatosplenomegaly  Musculoskeletal: No deformities; no active joint inflammation  Extremities: No edema, cyanosis, clubbing  Vessels: Symmetric bilaterally  Neurologic: Alert and oriented; speech intact; face symmetrical; moves all extremities well; CNII-XII intact without focal deficit   Assessment:  1. PE (physical exam), annual   2. Pre-diabetes   3. Lipid screening   4. Other fatigue     Plan:  Age appropriate preventive healthcare needs addressed; encouraged regular eye doctor and dental exams; encouraged regular exercise and weight loss goals- will refer to nutritionist; will update labs and refills as needed today; follow-up with new PCP  as discussed.    No follow-ups on file.  Orders Placed This Encounter  Procedures   CBC with Differential/Platelet   Comp Met (CMET)   Lipid panel   TSH   Hemoglobin A1c   Amb Referral to Nutrition and Diabetic Education    Referral Priority:   Routine    Referral Type:   Consultation    Referral Reason:   Specialty Services Required    Number of Visits Requested:   1    Requested Prescriptions     No prescriptions requested or ordered in this encounter

## 2024-05-01 NOTE — Addendum Note (Signed)
 Addended by: ORVIN HARLENE HERO on: 05/01/2024 10:40 AM   Modules accepted: Orders

## 2024-05-02 ENCOUNTER — Ambulatory Visit (INDEPENDENT_AMBULATORY_CARE_PROVIDER_SITE_OTHER): Admitting: Family Medicine

## 2024-05-02 ENCOUNTER — Encounter: Payer: Self-pay | Admitting: Family Medicine

## 2024-05-02 VITALS — BP 122/84 | HR 93 | Ht 63.0 in | Wt 212.0 lb

## 2024-05-02 DIAGNOSIS — M9902 Segmental and somatic dysfunction of thoracic region: Secondary | ICD-10-CM

## 2024-05-02 DIAGNOSIS — M9904 Segmental and somatic dysfunction of sacral region: Secondary | ICD-10-CM

## 2024-05-02 DIAGNOSIS — M9908 Segmental and somatic dysfunction of rib cage: Secondary | ICD-10-CM

## 2024-05-02 DIAGNOSIS — M4316 Spondylolisthesis, lumbar region: Secondary | ICD-10-CM

## 2024-05-02 DIAGNOSIS — M9903 Segmental and somatic dysfunction of lumbar region: Secondary | ICD-10-CM | POA: Diagnosis not present

## 2024-05-02 DIAGNOSIS — M9901 Segmental and somatic dysfunction of cervical region: Secondary | ICD-10-CM

## 2024-05-02 NOTE — Patient Instructions (Signed)
 Good to see you.  Keep raising that [employer]. See me again in 2 to 3 months.

## 2024-05-02 NOTE — Assessment & Plan Note (Signed)
 History of the spondylolisthesis.  Discussed icing regimen and home exercises.  Discussed increasing activity slowly.  Discussed which activities to do and which ones to avoid.  Increase activity slowly.  Discussed icing regimen.  Follow-up again in 6 to 8 weeks patient is going to work and losing weight.  Wants to get BMI under 30.  Going to start working with a nutritionist.

## 2024-05-28 NOTE — Progress Notes (Signed)
 Medical Nutrition Therapy  Appointment Start time:  [phone]  Appointment End time:  1023  Primary concerns today: preventing type two diabetes  Referral diagnosis: Pre-diabetes  Preferred learning style:  no preference indicated Learning readiness: change in progress   NUTRITION ASSESSMENT   Clinical Medical Hx:  Past Medical History:  Diagnosis Date   History of varicella    Hypertension    no meds    Medications: D3, multivitamins, probiotics Current Outpatient Medications:    levonorgestrel (KYLEENA) 19.5 MG IUD, by Intrauterine route once., Disp: , Rfl:    Multiple Vitamin (MULTIVITAMIN) tablet, Take 1 tablet by mouth daily., Disp: , Rfl:   Labs:  Lab Results  Component Value Date   HGBA1C 6.2 05/01/2024   Last vitamin D  Lab Results  Component Value Date   VD25OH 31 05/02/2020   Lab Results  Component Value Date   CHOL 153 05/01/2024   HDL 54.00 05/01/2024   LDLCALC 86 05/01/2024   TRIG 64.0 05/01/2024   CHOLHDL 3 05/01/2024   Notable Signs/Symptoms:  Wt Readings from Last 3 Encounters:  06/08/24 205 lb 8 oz (93.2 kg)  05/02/24 212 lb (96.2 kg)  05/01/24 214 lb (97.1 kg)  Pt's body weight has decreased 9 # or 4% in the past 6 weeks and is desired and intentional    Lifestyle & Dietary Hx Pt presents today alone. Pt reports she desires to improve her A1C and prevent or delay progression to overt diabetes. Pt reports she avoids pork, shrimp and beef. Pt reports her husband has diabetes and he does the cooking and shopping for a family of 3. Pt reports making the following changes over the past 2 months including increase physical activity, increasing fiber, smaller portions and increasing water intake. Pt reports she works full time mostly sitting and is aiming to interrupt sitting with a 10 minute walk. Pt reports the following challenges with lifestyle changes including back pain, cooking and decreasing eating out. Pt reports she eat out 7 times weekly. All Pt's  questions were answered during this encounter.   Estimated daily fluid intake: 40-60 oz Supplements: D3, multivitamins, probiotics Sleep:  good  6-8 hours  Stress / self-care:  7 out of 10 / self care includes: scrolling on social media, breathing Current average weekly physical activity: walking pad 10-30 minutes on 3-4 days weekly   24-Hr Dietary Recall First Meal: skips, coffee with french vanilla creamer and collagen powder, cinnamon, splenda Snack: none Second Meal: ~11 am: water or diet soda, [employer] salad with dressing, chicken, egg, avocado, apple or chips  Snack: 2-3p: apple with almonds Third Meal: ~6 pm: baked or fried chicken wing, green beans, broccoli, sweet potato or ghasssans rice bowl or greek salad with pita  Snack: ~ 2 handful grapes or ~ 2 cups melon or chips  Beverages: water, water with lemon, bubbly, diet soda, coffee with french vanilla creamer  NUTRITION DIAGNOSIS  NB-1.1 Food and nutrition-related knowledge deficit As related to no prior nutrition education.  As evidenced by Pt's reports and dietary recall.  NUTRITION INTERVENTION  Nutrition education (E-1) on the following topics:  Fruits & Vegetables: Aim to fill half your plate with a variety of fruits and vegetables. They are rich in vitamins, minerals, and fiber, and can help reduce the risk of chronic diseases. Choose a colorful assortment of fruits and vegetables to ensure you get a wide range of nutrients. Grains and Starches: Make at least half of your grain choices whole grains, such as  brown rice, whole wheat bread, and oats. Whole grains provide fiber, which aids in digestion and healthy cholesterol levels. Aim for whole forms of starchy vegetables such as potatoes, sweet potatoes, beans, peas, and corn, which are fiber rich and provide many vitamins and minerals.  Protein: Incorporate lean sources of protein, such as poultry, fish, beans, nuts, and seeds, into your meals. Protein is essential for  building and repairing tissues, staying full, balancing blood sugar, as well as supporting immune function. Dairy: Include low-fat or fat-free dairy products like milk, yogurt, and cheese in your diet. Dairy foods are excellent sources of calcium and vitamin D , which are crucial for bone health.  Physical Activity: Aim for 60 minutes of physical activity daily. Regular physical activity promotes overall health-including helping to reduce risk for heart disease and diabetes, promoting mental health, and helping us  sleep better.    Handouts Provided Include  Move Your Way-[employer]HS Prediabetes-Novo Plate Planner-[employer] Meal Ideas  Learning Style & Readiness for Change Teaching method utilized: Visual & Auditory  Demonstrated degree of understanding via: Teach Back  Barriers to learning/adherence to lifestyle change: time management, back pain  Goals Established by Pt Increase physical activity  Aim to include a balanced breakfast    MONITORING & EVALUATION Dietary intake, weekly physical activity, and labs, body weight  Next Steps  Patient is to return: 08/03/2024

## 2024-06-08 ENCOUNTER — Encounter: Attending: Family Medicine | Admitting: Dietician

## 2024-06-08 ENCOUNTER — Ambulatory Visit: Admitting: Dietician

## 2024-06-08 VITALS — Wt 205.5 lb

## 2024-06-08 DIAGNOSIS — O24919 Unspecified diabetes mellitus in pregnancy, unspecified trimester: Secondary | ICD-10-CM | POA: Diagnosis present

## 2024-06-08 NOTE — Patient Instructions (Signed)
 Increase physical activity  Aim to include a balanced breakfast

## 2024-07-03 NOTE — Progress Notes (Deleted)
  [name] [address] [place] [address] Phone: [phone] Subjective:    I'm seeing this patient by the request  of:  Jason Leita Repine, FNP (Inactive)  CC: Back and neck pain follow-up  Kathy Dixon  Kathy Dixon is a 43 y.o. female coming in with complaint of back and neck pain. OMT on 05/02/2024. Patient states   Medications patient has been prescribed:   Taking:       Reviewed prior external information including notes and imaging from previsou exam, outside providers and external EMR if available.   As well as notes that were available from care everywhere and other healthcare systems.  Past medical history, social, surgical and family history all reviewed in electronic medical record.  No pertanent information unless stated regarding to the chief complaint.   Past Medical History:  Diagnosis Date   History of varicella    Hypertension    no meds    No Known Allergies   Review of Systems:  No headache, visual changes, nausea, vomiting, diarrhea, constipation, dizziness, abdominal pain, skin rash, fevers, chills, night sweats, weight loss, swollen lymph nodes, body aches, joint swelling, chest pain, shortness of breath, mood changes. POSITIVE muscle aches  Objective  There were no vitals taken for this visit.   General: No apparent distress alert and oriented x3 mood and affect normal, dressed appropriately.  HEENT: Pupils equal, extraocular movements intact  Respiratory: Patient's speak in full sentences and does not appear short of breath  Cardiovascular: No lower extremity edema, non tender, no erythema  Gait relatively normal MSK:  Back low back does have some loss of lordosis.  Some tightness noted in the paraspinal musculature.  Some limited extension and flexion noted.  Osteopathic findings  C3 flexed rotated and side bent right C6 flexed rotated and side bent left T3 extended rotated and side bent  right inhaled rib T9 extended rotated and side bent left L2 flexed rotated and side bent right Sacrum right on right    Assessment and Plan:  No problem-specific Assessment & Plan notes found for this encounter.    Nonallopathic problems  Decision today to treat with OMT was based on Physical Exam  After verbal consent patient was treated with HVLA, ME, FPR techniques in cervical, rib, thoracic, lumbar, and sacral  areas  Patient tolerated the procedure well with improvement in symptoms  Patient given exercises, stretches and lifestyle modifications  See medications in patient instructions if given  Patient will follow up in 4-8 weeks    The above documentation has been reviewed and is accurate and complete Alainna Stawicki M Johonna Binette, DO        Note: This dictation was prepared with Dragon dictation along with smaller phrase technology. Any transcriptional errors that result from this process are unintentional.

## 2024-07-04 ENCOUNTER — Ambulatory Visit: Admitting: Family Medicine

## 2024-07-26 NOTE — Progress Notes (Unsigned)
 Medical Nutrition Therapy  Appointment Start time:  [phone]  Appointment End time:  1023  Primary concerns today: preventing type two diabetes  Referral diagnosis: Pre-diabetes  Preferred learning style:  no preference indicated Learning readiness: change in progress   NUTRITION ASSESSMENT   Clinical Medical Hx:  Past Medical History:  Diagnosis Date   History of varicella    Hypertension    no meds    Medications: D3, multivitamins, probiotics Current Outpatient Medications:    levonorgestrel (KYLEENA) 19.5 MG IUD, by Intrauterine route once., Disp: , Rfl:    Multiple Vitamin (MULTIVITAMIN) tablet, Take 1 tablet by mouth daily., Disp: , Rfl:   Labs:  Lab Results  Component Value Date   HGBA1C 6.2 05/01/2024   Last vitamin D  Lab Results  Component Value Date   VD25OH 31 05/02/2020   Lab Results  Component Value Date   CHOL 153 05/01/2024   HDL 54.00 05/01/2024   LDLCALC 86 05/01/2024   TRIG 64.0 05/01/2024   CHOLHDL 3 05/01/2024   Notable Signs/Symptoms:  Wt Readings from Last 3 Encounters:  06/08/24 205 lb 8 oz (93.2 kg)  05/02/24 212 lb (96.2 kg)  05/01/24 214 lb (97.1 kg)  Pt's body weight has decreased 9 # or 4% in the past 6 weeks and is desired and intentional    Lifestyle & Dietary Hx Pt presents today alone. Pt reports she desires to improve her A1C and prevent or delay progression to overt diabetes. Pt reports she avoids pork, shrimp and beef. Pt reports her husband has diabetes and he does the cooking and shopping for a family of 3. Pt reports making the following changes over the past 2 months including increase physical activity, increasing fiber, smaller portions and increasing water intake. Pt reports she works full time mostly sitting and is aiming to interrupt sitting with a 10 minute walk. Pt reports the following challenges with lifestyle changes including back pain, cooking and decreasing eating out. Pt reports she eat out 7 times weekly. All Pt's  questions were answered during this encounter.   Estimated daily fluid intake: 40-60 oz Supplements: D3, multivitamins, probiotics Sleep:  good  6-8 hours  Stress / self-care:  7 out of 10 / self care includes: scrolling on social media, breathing Current average weekly physical activity: walking pad 10-30 minutes on 3-4 days weekly   24-Hr Dietary Recall First Meal: skips, coffee with french vanilla creamer and collagen powder, cinnamon, splenda Snack: none Second Meal: ~11 am: water or diet soda, [employer] salad with dressing, chicken, egg, avocado, apple or chips  Snack: 2-3p: apple with almonds Third Meal: ~6 pm: baked or fried chicken wing, green beans, broccoli, sweet potato or ghasssans rice bowl or greek salad with pita  Snack: ~ 2 handful grapes or ~ 2 cups melon or chips  Beverages: water, water with lemon, bubbly, diet soda, coffee with french vanilla creamer  NUTRITION DIAGNOSIS  NB-1.1 Food and nutrition-related knowledge deficit As related to no prior nutrition education.  As evidenced by Pt's reports and dietary recall.  NUTRITION INTERVENTION  Nutrition education (E-1) on the following topics:  Fruits & Vegetables: Aim to fill half your plate with a variety of fruits and vegetables. They are rich in vitamins, minerals, and fiber, and can help reduce the risk of chronic diseases. Choose a colorful assortment of fruits and vegetables to ensure you get a wide range of nutrients. Grains and Starches: Make at least half of your grain choices whole grains, such as  brown rice, whole wheat bread, and oats. Whole grains provide fiber, which aids in digestion and healthy cholesterol levels. Aim for whole forms of starchy vegetables such as potatoes, sweet potatoes, beans, peas, and corn, which are fiber rich and provide many vitamins and minerals.  Protein: Incorporate lean sources of protein, such as poultry, fish, beans, nuts, and seeds, into your meals. Protein is essential for  building and repairing tissues, staying full, balancing blood sugar, as well as supporting immune function. Dairy: Include low-fat or fat-free dairy products like milk, yogurt, and cheese in your diet. Dairy foods are excellent sources of calcium and vitamin D , which are crucial for bone health.  Physical Activity: Aim for 60 minutes of physical activity daily. Regular physical activity promotes overall health-including helping to reduce risk for heart disease and diabetes, promoting mental health, and helping us  sleep better.    Handouts Provided Include  Move Your Way-[facility] Prediabetes-Novo Plate Planner-[employer] Meal Ideas  Learning Style & Readiness for Change Teaching method utilized: Visual & Auditory  Demonstrated degree of understanding via: Teach Back  Barriers to learning/adherence to lifestyle change: time management, back pain  Goals Established by Pt Increase physical activity  Aim to include a balanced breakfast    MONITORING & EVALUATION Dietary intake, weekly physical activity, and labs, body weight  Next Steps  Patient is to return: 08/03/2024

## 2024-08-03 ENCOUNTER — Encounter: Admitting: Dietician

## 2024-08-03 VITALS — Wt 206.0 lb

## 2024-08-03 DIAGNOSIS — R7303 Prediabetes: Secondary | ICD-10-CM | POA: Insufficient documentation

## 2024-08-03 DIAGNOSIS — O24919 Unspecified diabetes mellitus in pregnancy, unspecified trimester: Secondary | ICD-10-CM

## 2024-08-03 NOTE — Patient Instructions (Signed)
 Increase physical activity aiming for 20 minutes 4 days weekly Increase water intake 64 ounces

## 2024-08-28 NOTE — Progress Notes (Signed)
" °  [name] [address] [place] [address] Phone: [phone] Subjective:   Kathy Dixon, am serving as a scribe for Dr. Arthea Claudene.  I'm seeing this patient by the request  of:  Jason Leita Repine, FNP (Inactive)  CC: Neck and back pain follow-up  YEP:Dlagzrupcz  Kathy Dixon is a 44 y.o. female coming in with complaint of back and neck pain. OMT on 05/02/2024. Patient states that she missed her last appointment and has been in pain due to the longer interval. Today she is feeling ok.   Medications patient has been prescribed:   Taking:       Patient has known spondylolisthesis at L4-L5  Reviewed prior external information including notes and imaging from previsou exam, outside providers and external EMR if available.   As well as notes that were available from care everywhere and other healthcare systems.  Past medical history, social, surgical and family history all reviewed in electronic medical record.  No pertanent information unless stated regarding to the chief complaint.   Past Medical History:  Diagnosis Date   History of varicella    Hypertension    no meds    Allergies[1]   Review of Systems:  No headache, visual changes, nausea, vomiting, diarrhea, constipation, dizziness, abdominal pain, skin rash, fevers, chills, night sweats, weight loss, swollen lymph nodes, body aches, joint swelling, chest pain, shortness of breath, mood changes. POSITIVE muscle aches  Objective  Blood pressure 118/76, pulse 70, height 5' 3 (1.6 m), SpO2 98%.   General: No apparent distress alert and oriented x3 mood and affect normal, dressed appropriately.  HEENT: Pupils equal, extraocular movements intact  Respiratory: Patient's speak in full sentences and does not appear short of breath  Cardiovascular: No lower extremity edema, non tender, no erythema  Gait relatively normal MSK:  Back does have some loss lordosis noted.   Some tenderness to palpation in the paraspinal musculature.  Osteopathic findings  C6 flexed rotated and side bent left T3 extended rotated and side bent right inhaled rib L2 flexed rotated and side bent right L4 flexed rotated and side bent right L5 flexed rotated sidebent left Sacrum right on right     Assessment and Plan:  Spondylolisthesis at L4-L5 level Known spondylolisthesis.  Patient is doing extremely well working on weight loss.  We discussed with patient about continuing this.  Patient is working with a nutritionist and is going to consider talking to new family medicine provider.  Patient does respond well though to osteopathic manipulation for the pain.  Discussed ergonomics as well.  Follow-up again in 6 to 12 weeks    Nonallopathic problems  Decision today to treat with OMT was based on Physical Exam  After verbal consent patient was treated with HVLA, ME, FPR techniques in cervical, rib, thoracic, lumbar, and sacral  areas  Patient tolerated the procedure well with improvement in symptoms  Patient given exercises, stretches and lifestyle modifications  See medications in patient instructions if given  Patient will follow up in 4-8 weeks    The above documentation has been reviewed and is accurate and complete Benny Deutschman M Elihu Milstein, DO          Note: This dictation was prepared with Dragon dictation along with smaller phrase technology. Any transcriptional errors that result from this process are unintentional.            [1] No Known Allergies  "

## 2024-08-31 ENCOUNTER — Ambulatory Visit: Admitting: Family Medicine

## 2024-08-31 ENCOUNTER — Encounter: Payer: Self-pay | Admitting: Family Medicine

## 2024-08-31 VITALS — BP 118/76 | HR 70 | Ht 63.0 in

## 2024-08-31 DIAGNOSIS — M9903 Segmental and somatic dysfunction of lumbar region: Secondary | ICD-10-CM

## 2024-08-31 DIAGNOSIS — M4316 Spondylolisthesis, lumbar region: Secondary | ICD-10-CM | POA: Diagnosis not present

## 2024-08-31 DIAGNOSIS — M9904 Segmental and somatic dysfunction of sacral region: Secondary | ICD-10-CM | POA: Diagnosis not present

## 2024-08-31 DIAGNOSIS — M9901 Segmental and somatic dysfunction of cervical region: Secondary | ICD-10-CM | POA: Diagnosis not present

## 2024-08-31 DIAGNOSIS — M9902 Segmental and somatic dysfunction of thoracic region: Secondary | ICD-10-CM

## 2024-08-31 DIAGNOSIS — M9908 Segmental and somatic dysfunction of rib cage: Secondary | ICD-10-CM | POA: Diagnosis not present

## 2024-08-31 NOTE — Assessment & Plan Note (Signed)
 Known spondylolisthesis.  Patient is doing extremely well working on weight loss.  We discussed with patient about continuing this.  Patient is working with a nutritionist and is going to consider talking to new family medicine provider.  Patient does respond well though to osteopathic manipulation for the pain.  Discussed ergonomics as well.  Follow-up again in 6 to 12 weeks

## 2024-08-31 NOTE — Patient Instructions (Signed)
 Remember the code words  See me in 2 months

## 2024-09-02 NOTE — Patient Instructions (Incomplete)
 Welcome to [facility]!  Thank you for choosing us  for your Primary Care needs.   We offer in person and video appointments for your convenience. You may call our office to schedule appointments, or you may schedule appointments with me through MyChart.   The best way to get in contact with me is via MyChart message. This will get to me faster than a phone call, unless there is an emergency, then please call 911.  The lab is located downstairs in the Sports Medicine building, we also have xray available there.

## 2024-09-02 NOTE — Progress Notes (Unsigned)
 "  New Patient Visit  Subjective:     Patient ID: Kathy Dixon, female    DOB: [birth year], 44 y.o.   MRN: [id number]  No chief complaint on file.   HPI  Discussed the use of AI scribe software for clinical note transcription with the patient, who gave verbal consent to proceed.  History of Present Illness      ROS Per HPI  Outpatient Encounter Medications as of 09/03/2024  Medication Sig   levonorgestrel (KYLEENA) 19.5 MG IUD by Intrauterine route once.   Multiple Vitamin (MULTIVITAMIN) tablet Take 1 tablet by mouth daily.   No facility-administered encounter medications on file as of 09/03/2024.    Past Medical History:  Diagnosis Date   History of varicella    Hypertension    no meds    Past Surgical History:  Procedure Laterality Date   CESAREAN SECTION N/A 09/20/2016   Procedure: CESAREAN SECTION;  Surgeon: Lynwood Clubs, MD;  Location: [facility] BIRTHING SUITES;  Service: Obstetrics;  Laterality: N/A;    Family History  Problem Relation Age of Onset   Hypertension Mother    Hypertension Father    Other Father        myelofibrosis   Hypertension Sister    Diabetes Maternal Aunt    Stroke Maternal Aunt    Stroke Maternal Uncle     Social History   Socioeconomic History   Marital status: Married    Spouse name: Not on file   Number of children: Not on file   Years of education: Not on file   Highest education level: Bachelor's degree (e.g., BA, AB, BS)  Occupational History   Not on file  Tobacco Use   Smoking status: Never   Smokeless tobacco: Never  Substance and Sexual Activity   Alcohol use: No    Comment: occ   Drug use: No   Sexual activity: Not on file  Other Topics Concern   Not on file  Social History Narrative   Not on file   Social Drivers of Health   Tobacco Use: Low Risk (08/31/2024)   Patient History    Smoking Tobacco Use: Never    Smokeless Tobacco Use: Never    Passive Exposure: Not on file  Financial Resource Strain: Low  Risk (05/01/2024)   Overall Financial Resource Strain (CARDIA)    Difficulty of Paying Living Expenses: Not hard at all  Food Insecurity: No Food Insecurity (06/08/2024)   Epic    Worried About Programme Researcher, Broadcasting/film/video in the Last Year: Never true    Ran Out of Food in the Last Year: Never true  Transportation Needs: No Transportation Needs (05/01/2024)   Epic    Lack of Transportation (Medical): No    Lack of Transportation (Non-Medical): No  Physical Activity: Insufficiently Active (05/01/2024)   Exercise Vital Sign    Days of Exercise per Week: 3 days    Minutes of Exercise per Session: 20 min  Stress: No Stress Concern Present (05/01/2024)   [employer] of Occupational Health - Occupational Stress Questionnaire    Feeling of Stress: Only a little  Social Connections: Socially Integrated (05/01/2024)   Social Connection and Isolation Panel    Frequency of Communication with Friends and Family: Twice a week    Frequency of Social Gatherings with Friends and Family: Once a week    Attends Religious Services: More than 4 times per year    Active Member of Clubs or Organizations: Yes    Attends  Club or Organization Meetings: More than 4 times per year    Marital Status: Married  Catering Manager Violence: Not on file  Depression (PHQ2-9): Low Risk (06/08/2024)   Depression (PHQ2-9)    PHQ-2 Score: 0  Alcohol Screen: Low Risk (05/01/2024)   Alcohol Screen    Last Alcohol Screening Score (AUDIT): 1  Housing: Low Risk (05/01/2024)   Epic    Unable to Pay for Housing in the Last Year: No    Number of Times Moved in the Last Year: 0    Homeless in the Last Year: No  Utilities: Not on file  Health Literacy: Not on file       Objective:    There were no vitals taken for this visit.   Physical Exam Vitals and nursing note reviewed.  Constitutional:      General: She is not in acute distress.    Appearance: Normal appearance. She is normal weight.  HENT:     Head: Normocephalic and  atraumatic.     Right Ear: External ear normal.     Left Ear: External ear normal.     Nose: Nose normal.     Mouth/Throat:     Mouth: Mucous membranes are moist.     Pharynx: Oropharynx is clear.  Eyes:     Extraocular Movements: Extraocular movements intact.     Pupils: Pupils are equal, round, and reactive to light.  Cardiovascular:     Rate and Rhythm: Normal rate and regular rhythm.     Pulses: Normal pulses.     Heart sounds: Normal heart sounds.  Pulmonary:     Effort: Pulmonary effort is normal. No respiratory distress.     Breath sounds: Normal breath sounds. No wheezing, rhonchi or rales.  Musculoskeletal:        General: Normal range of motion.     Cervical back: Normal range of motion.     Right lower leg: No edema.     Left lower leg: No edema.  Lymphadenopathy:     Cervical: No cervical adenopathy.  Neurological:     General: No focal deficit present.     Mental Status: She is alert and oriented to person, place, and time.  Psychiatric:        Mood and Affect: Mood normal.        Thought Content: Thought content normal.     No results found for any visits on 09/03/24.      Assessment & Plan:   Assessment and Plan Assessment & Plan      No orders of the defined types were placed in this encounter.    No orders of the defined types were placed in this encounter.   No follow-ups on file.  Corean LITTIE Ku, FNP   "

## 2024-09-03 ENCOUNTER — Ambulatory Visit: Payer: Self-pay | Admitting: Family Medicine

## 2024-09-03 ENCOUNTER — Ambulatory Visit: Admitting: Family Medicine

## 2024-09-03 VITALS — BP 124/90 | HR 76 | Temp 98.5°F | Ht 63.0 in | Wt 204.2 lb

## 2024-09-03 DIAGNOSIS — E559 Vitamin D deficiency, unspecified: Secondary | ICD-10-CM | POA: Diagnosis not present

## 2024-09-03 DIAGNOSIS — R7303 Prediabetes: Secondary | ICD-10-CM | POA: Diagnosis not present

## 2024-09-03 LAB — HEMOGLOBIN A1C: Hgb A1c MFr Bld: 6 % (ref 4.6–6.5)

## 2024-09-03 LAB — CBC WITH DIFFERENTIAL/PLATELET
Basophils Absolute: 0 K/uL (ref 0.0–0.1)
Basophils Relative: 0.4 % (ref 0.0–3.0)
Eosinophils Absolute: 0.1 K/uL (ref 0.0–0.7)
Eosinophils Relative: 1.4 % (ref 0.0–5.0)
HCT: 38.7 % (ref 36.0–46.0)
Hemoglobin: 12.9 g/dL (ref 12.0–15.0)
Lymphocytes Relative: 41.1 % (ref 12.0–46.0)
Lymphs Abs: 2.6 K/uL (ref 0.7–4.0)
MCHC: 33.2 g/dL (ref 30.0–36.0)
MCV: 90.4 fl (ref 78.0–100.0)
Monocytes Absolute: 0.5 K/uL (ref 0.1–1.0)
Monocytes Relative: 7.9 % (ref 3.0–12.0)
Neutro Abs: 3.2 K/uL (ref 1.4–7.7)
Neutrophils Relative %: 49.2 % (ref 43.0–77.0)
Platelets: 287 K/uL (ref 150.0–400.0)
RBC: 4.28 Mil/uL (ref 3.87–5.11)
RDW: 13.6 % (ref 11.5–15.5)
WBC: 6.4 K/uL (ref 4.0–10.5)

## 2024-09-03 LAB — COMPREHENSIVE METABOLIC PANEL WITH GFR
ALT: 16 U/L (ref 3–35)
AST: 17 U/L (ref 5–37)
Albumin: 4.4 g/dL (ref 3.5–5.2)
Alkaline Phosphatase: 45 U/L (ref 39–117)
BUN: 10 mg/dL (ref 6–23)
CO2: 28 meq/L (ref 19–32)
Calcium: 9.2 mg/dL (ref 8.4–10.5)
Chloride: 102 meq/L (ref 96–112)
Creatinine, Ser: 0.75 mg/dL (ref 0.40–1.20)
GFR: 96.59 mL/min
Glucose, Bld: 88 mg/dL (ref 70–99)
Potassium: 4 meq/L (ref 3.5–5.1)
Sodium: 137 meq/L (ref 135–145)
Total Bilirubin: 0.5 mg/dL (ref 0.2–1.2)
Total Protein: 7.8 g/dL (ref 6.0–8.3)

## 2024-09-03 LAB — VITAMIN D 25 HYDROXY (VIT D DEFICIENCY, FRACTURES): VITD: 64.74 ng/mL (ref 30.00–100.00)

## 2024-10-31 ENCOUNTER — Ambulatory Visit: Admitting: Family Medicine
